# Patient Record
Sex: Female | Born: 1980 | Race: Black or African American | Hispanic: No | Marital: Single | Smoking: Never smoker
Health system: Southern US, Community
[De-identification: ages and names within clinical notes are randomized; demographics above are authoritative.]

## PROBLEM LIST (undated history)

## (undated) DIAGNOSIS — Z8709 Personal history of other diseases of the respiratory system: Secondary | ICD-10-CM

## (undated) DIAGNOSIS — Z8719 Personal history of other diseases of the digestive system: Secondary | ICD-10-CM

## (undated) DIAGNOSIS — Z8619 Personal history of other infectious and parasitic diseases: Secondary | ICD-10-CM

## (undated) DIAGNOSIS — E669 Obesity, unspecified: Secondary | ICD-10-CM

## (undated) DIAGNOSIS — Z87448 Personal history of other diseases of urinary system: Secondary | ICD-10-CM

## (undated) HISTORY — DX: Obesity, unspecified: E66.9

## (undated) HISTORY — DX: Personal history of other diseases of the respiratory system: Z87.09

## (undated) HISTORY — DX: Personal history of other diseases of urinary system: Z87.448

## (undated) HISTORY — DX: Personal history of other infectious and parasitic diseases: Z86.19

---

## 2001-05-14 HISTORY — PX: LAPAROSCOPIC GASTRIC BAND REMOVAL WITH LAPAROSCOPIC GASTRIC SLEEVE RESECTION: SHX6498

## 2004-07-28 ENCOUNTER — Other Ambulatory Visit: Admission: RE | Admit: 2004-07-28 | Discharge: 2004-07-28 | Payer: Self-pay | Admitting: Obstetrics and Gynecology

## 2004-12-13 ENCOUNTER — Ambulatory Visit (HOSPITAL_COMMUNITY): Admission: RE | Admit: 2004-12-13 | Discharge: 2004-12-13 | Payer: Self-pay | Admitting: Obstetrics and Gynecology

## 2004-12-14 ENCOUNTER — Ambulatory Visit: Payer: Self-pay | Admitting: Obstetrics & Gynecology

## 2004-12-21 ENCOUNTER — Ambulatory Visit (HOSPITAL_COMMUNITY): Admission: RE | Admit: 2004-12-21 | Discharge: 2004-12-21 | Payer: Self-pay | Admitting: Obstetrics and Gynecology

## 2005-02-22 ENCOUNTER — Inpatient Hospital Stay (HOSPITAL_COMMUNITY): Admission: AD | Admit: 2005-02-22 | Discharge: 2005-02-25 | Payer: Self-pay | Admitting: Obstetrics and Gynecology

## 2006-05-14 HISTORY — PX: HERNIA REPAIR: SHX51

## 2006-12-01 ENCOUNTER — Emergency Department: Payer: Self-pay | Admitting: Unknown Physician Specialty

## 2006-12-10 ENCOUNTER — Inpatient Hospital Stay: Payer: Self-pay | Admitting: Surgery

## 2008-07-17 ENCOUNTER — Emergency Department: Payer: Self-pay | Admitting: Emergency Medicine

## 2012-05-14 NOTE — L&D Delivery Note (Signed)
Delivery Note Pt progressed to complete and pushed well.  At 8:10 PM a viable female was delivered via Vaginal, Spontaneous Delivery (Presentation: Right Occiput Anterior).  APGAR: 9, 9; weight pending.   Placenta status: Intact, Spontaneous.  Cord:  with the following complications: None.  Anesthesia: Epidural  Episiotomy: None Lacerations: 1st degree Suture Repair: 3.0 vicryl rapide Est. Blood Loss (mL): 500  Mom to postpartum.  Baby to stay with mom.  Alailah Safley D 01/30/2013, 8:39 PM

## 2012-06-24 LAB — OB RESULTS CONSOLE RPR: RPR: NONREACTIVE

## 2012-06-24 LAB — OB RESULTS CONSOLE RUBELLA ANTIBODY, IGM: Rubella: IMMUNE

## 2012-06-24 LAB — OB RESULTS CONSOLE GC/CHLAMYDIA
Chlamydia: NEGATIVE
Gonorrhea: NEGATIVE

## 2012-06-24 LAB — OB RESULTS CONSOLE ABO/RH: RH Type: POSITIVE

## 2012-06-24 LAB — OB RESULTS CONSOLE HEPATITIS B SURFACE ANTIGEN: Hepatitis B Surface Ag: NEGATIVE

## 2012-06-24 LAB — OB RESULTS CONSOLE HIV ANTIBODY (ROUTINE TESTING): HIV: NONREACTIVE

## 2012-06-24 LAB — OB RESULTS CONSOLE ANTIBODY SCREEN: Antibody Screen: NEGATIVE

## 2012-10-16 ENCOUNTER — Observation Stay: Payer: Self-pay | Admitting: Obstetrics and Gynecology

## 2012-10-16 LAB — CBC WITH DIFFERENTIAL/PLATELET
Basophil #: 0.1 10*3/uL (ref 0.0–0.1)
Basophil %: 1 %
Eosinophil #: 0.3 10*3/uL (ref 0.0–0.7)
Eosinophil %: 2.2 %
HCT: 36.3 % (ref 35.0–47.0)
HGB: 12.3 g/dL (ref 12.0–16.0)
Lymphocyte #: 2.6 10*3/uL (ref 1.0–3.6)
Lymphocyte %: 21.2 %
MCH: 30.3 pg (ref 26.0–34.0)
MCHC: 33.9 g/dL (ref 32.0–36.0)
MCV: 89 fL (ref 80–100)
Monocyte #: 1 x10 3/mm — ABNORMAL HIGH (ref 0.2–0.9)
Monocyte %: 7.9 %
Neutrophil #: 8.2 10*3/uL — ABNORMAL HIGH (ref 1.4–6.5)
Neutrophil %: 67.7 %
Platelet: 224 10*3/uL (ref 150–440)
RBC: 4.06 10*6/uL (ref 3.80–5.20)
RDW: 12.7 % (ref 11.5–14.5)
WBC: 12.2 10*3/uL — ABNORMAL HIGH (ref 3.6–11.0)

## 2012-10-16 LAB — URINALYSIS, COMPLETE
Bilirubin,UR: NEGATIVE
Blood: NEGATIVE
Glucose,UR: NEGATIVE mg/dL (ref 0–75)
Ketone: NEGATIVE
Nitrite: NEGATIVE
Ph: 6 (ref 4.5–8.0)
Protein: NEGATIVE
RBC,UR: 5 /HPF (ref 0–5)
Specific Gravity: 1.018 (ref 1.003–1.030)
Squamous Epithelial: 2
WBC UR: 2 /HPF (ref 0–5)

## 2012-10-16 LAB — COMPREHENSIVE METABOLIC PANEL
Albumin: 2.9 g/dL — ABNORMAL LOW (ref 3.4–5.0)
Alkaline Phosphatase: 114 U/L (ref 50–136)
Anion Gap: 6 — ABNORMAL LOW (ref 7–16)
BUN: 6 mg/dL — ABNORMAL LOW (ref 7–18)
Bilirubin,Total: 0.3 mg/dL (ref 0.2–1.0)
Calcium, Total: 8.4 mg/dL — ABNORMAL LOW (ref 8.5–10.1)
Chloride: 107 mmol/L (ref 98–107)
Co2: 23 mmol/L (ref 21–32)
Creatinine: 0.53 mg/dL — ABNORMAL LOW (ref 0.60–1.30)
EGFR (African American): >60
EGFR (Non-African Amer.): >60
Glucose: 84 mg/dL (ref 65–99)
Osmolality: 269 (ref 275–301)
Potassium: 3.8 mmol/L (ref 3.5–5.1)
SGOT(AST): 21 U/L (ref 15–37)
SGPT (ALT): 26 U/L (ref 12–78)
Sodium: 136 mmol/L (ref 136–145)
Total Protein: 6.7 g/dL (ref 6.4–8.2)

## 2012-10-16 LAB — AMYLASE: Amylase: 61 U/L (ref 25–115)

## 2012-10-16 LAB — LIPASE, BLOOD: Lipase: 97 U/L (ref 73–393)

## 2012-11-25 ENCOUNTER — Emergency Department: Payer: Self-pay | Admitting: Emergency Medicine

## 2012-11-25 LAB — COMPREHENSIVE METABOLIC PANEL
Albumin: 3 g/dL — ABNORMAL LOW (ref 3.4–5.0)
Alkaline Phosphatase: 164 U/L — ABNORMAL HIGH (ref 50–136)
Anion Gap: 10 (ref 7–16)
BUN: 6 mg/dL — ABNORMAL LOW (ref 7–18)
Bilirubin,Total: 0.4 mg/dL (ref 0.2–1.0)
Calcium, Total: 8.8 mg/dL (ref 8.5–10.1)
Chloride: 106 mmol/L (ref 98–107)
Co2: 20 mmol/L — ABNORMAL LOW (ref 21–32)
Creatinine: 0.33 mg/dL — ABNORMAL LOW (ref 0.60–1.30)
EGFR (African American): >60
EGFR (Non-African Amer.): >60
Glucose: 73 mg/dL (ref 65–99)
Osmolality: 268 (ref 275–301)
Potassium: 4.4 mmol/L (ref 3.5–5.1)
SGOT(AST): 34 U/L (ref 15–37)
SGPT (ALT): 17 U/L (ref 12–78)
Sodium: 136 mmol/L (ref 136–145)
Total Protein: 7.1 g/dL (ref 6.4–8.2)

## 2012-11-25 LAB — CBC
HCT: 36.7 % (ref 35.0–47.0)
HGB: 12.4 g/dL (ref 12.0–16.0)
MCH: 29.9 pg (ref 26.0–34.0)
MCHC: 33.9 g/dL (ref 32.0–36.0)
MCV: 88 fL (ref 80–100)
Platelet: 207 10*3/uL (ref 150–440)
RBC: 4.16 10*6/uL (ref 3.80–5.20)
RDW: 12.6 % (ref 11.5–14.5)
WBC: 12.9 10*3/uL — ABNORMAL HIGH (ref 3.6–11.0)

## 2012-11-25 LAB — APTT: Activated PTT: 25.6 secs (ref 23.6–35.9)

## 2012-11-25 LAB — PROTIME-INR
INR: 1
Prothrombin Time: 13.7 secs (ref 11.5–14.7)

## 2013-01-02 LAB — OB RESULTS CONSOLE HIV ANTIBODY (ROUTINE TESTING): HIV: NONREACTIVE

## 2013-01-02 LAB — OB RESULTS CONSOLE RPR: RPR: NONREACTIVE

## 2013-01-02 LAB — OB RESULTS CONSOLE GBS: GBS: NEGATIVE

## 2013-01-26 ENCOUNTER — Telehealth (HOSPITAL_COMMUNITY): Payer: Self-pay | Admitting: *Deleted

## 2013-01-26 ENCOUNTER — Encounter (HOSPITAL_COMMUNITY): Payer: Self-pay | Admitting: *Deleted

## 2013-01-26 NOTE — Telephone Encounter (Signed)
Preadmission screen  

## 2013-01-30 ENCOUNTER — Encounter (HOSPITAL_COMMUNITY): Payer: Self-pay | Admitting: Anesthesiology

## 2013-01-30 ENCOUNTER — Inpatient Hospital Stay (HOSPITAL_COMMUNITY): Payer: BC Managed Care – PPO | Admitting: Anesthesiology

## 2013-01-30 ENCOUNTER — Inpatient Hospital Stay (HOSPITAL_COMMUNITY)
Admission: RE | Admit: 2013-01-30 | Discharge: 2013-02-01 | DRG: 373 | Disposition: A | Discharge status: Home / Self Care | Payer: BC Managed Care – PPO | Source: Ambulatory Visit | Attending: Obstetrics and Gynecology | Admitting: Obstetrics and Gynecology

## 2013-01-30 ENCOUNTER — Encounter (HOSPITAL_COMMUNITY): Payer: Self-pay

## 2013-01-30 VITALS — BP 146/83 | HR 84 | Temp 97.6°F | Resp 18 | Ht 64.0 in | Wt 308.0 lb

## 2013-01-30 DIAGNOSIS — O2650 Maternal hypotension syndrome, unspecified trimester: Secondary | ICD-10-CM | POA: Diagnosis not present

## 2013-01-30 DIAGNOSIS — Z3490 Encounter for supervision of normal pregnancy, unspecified, unspecified trimester: Secondary | ICD-10-CM

## 2013-01-30 HISTORY — DX: Personal history of other diseases of the digestive system: Z87.19

## 2013-01-30 LAB — CBC
HCT: 34.8 % — ABNORMAL LOW (ref 36.0–46.0)
Hemoglobin: 11.6 g/dL — ABNORMAL LOW (ref 12.0–15.0)
MCH: 28.1 pg (ref 26.0–34.0)
MCHC: 33.3 g/dL (ref 30.0–36.0)
MCV: 84.3 fL (ref 78.0–100.0)
Platelets: 231 10*3/uL (ref 150–400)
RBC: 4.13 MIL/uL (ref 3.87–5.11)
RDW: 12.9 % (ref 11.5–15.5)
WBC: 8.8 10*3/uL (ref 4.0–10.5)

## 2013-01-30 LAB — TYPE AND SCREEN
ABO/RH(D): O POS
Antibody Screen: NEGATIVE

## 2013-01-30 LAB — RPR: RPR Ser Ql: NONREACTIVE

## 2013-01-30 LAB — ABO/RH: ABO/RH(D): O POS

## 2013-01-30 MED ORDER — DIPHENHYDRAMINE HCL 25 MG PO CAPS: 25.0000 mg | ORAL_CAPSULE | Freq: Four times a day (QID) | ORAL | Status: DC | PRN

## 2013-01-30 MED ORDER — PHENYLEPHRINE 40 MCG/ML (10ML) SYRINGE FOR IV PUSH (FOR BLOOD PRESSURE SUPPORT)
80.0000 ug | PREFILLED_SYRINGE | INTRAVENOUS | Status: DC | PRN
  Filled 2013-01-30: qty 2

## 2013-01-30 MED ORDER — OXYTOCIN 40 UNITS IN LACTATED RINGERS INFUSION - SIMPLE MED
62.5000 mL/h | INTRAVENOUS | Status: DC
  Administered 2013-01-30: 62.5 mL/h via INTRAVENOUS

## 2013-01-30 MED ORDER — BUPIVACAINE HCL (PF) 0.25 % IJ SOLN
INTRAMUSCULAR | Status: DC | PRN
  Administered 2013-01-30 (×2): 5 mL

## 2013-01-30 MED ORDER — LANOLIN HYDROUS EX OINT: TOPICAL_OINTMENT | CUTANEOUS | Status: DC | PRN

## 2013-01-30 MED ORDER — FENTANYL 2.5 MCG/ML BUPIVACAINE 1/10 % EPIDURAL INFUSION (WH - ANES)
14.0000 mL/h | INTRAMUSCULAR | Status: DC | PRN
  Administered 2013-01-30 (×2): 14 mL/h via EPIDURAL
  Filled 2013-01-30 (×2): qty 125

## 2013-01-30 MED ORDER — PRENATAL MULTIVITAMIN CH
1.0000 | ORAL_TABLET | Freq: Every day | ORAL | Status: DC
  Administered 2013-01-31 – 2013-02-01 (×2): 1 via ORAL
  Filled 2013-01-30 (×2): qty 1

## 2013-01-30 MED ORDER — METHYLERGONOVINE MALEATE 0.2 MG/ML IJ SOLN: 0.2000 mg | INTRAMUSCULAR | Status: DC | PRN

## 2013-01-30 MED ORDER — OXYTOCIN BOLUS FROM INFUSION: 500.0000 mL | INTRAVENOUS | Status: DC

## 2013-01-30 MED ORDER — IBUPROFEN 600 MG PO TABS
600.0000 mg | ORAL_TABLET | Freq: Four times a day (QID) | ORAL | Status: DC | PRN
  Administered 2013-01-30: 600 mg via ORAL
  Filled 2013-01-30: qty 1

## 2013-01-30 MED ORDER — LIDOCAINE HCL (PF) 1 % IJ SOLN
INTRAMUSCULAR | Status: DC | PRN
  Administered 2013-01-30 (×4): 4 mL

## 2013-01-30 MED ORDER — OXYCODONE-ACETAMINOPHEN 5-325 MG PO TABS: 1.0000 | ORAL_TABLET | ORAL | Status: DC | PRN

## 2013-01-30 MED ORDER — LACTATED RINGERS IV SOLN
INTRAVENOUS | Status: DC
  Administered 2013-01-30 (×4): via INTRAVENOUS

## 2013-01-30 MED ORDER — ONDANSETRON HCL 4 MG/2ML IJ SOLN: 4.0000 mg | INTRAMUSCULAR | Status: DC | PRN

## 2013-01-30 MED ORDER — SENNOSIDES-DOCUSATE SODIUM 8.6-50 MG PO TABS
2.0000 | ORAL_TABLET | Freq: Every day | ORAL | Status: DC
  Administered 2013-01-31: 2 via ORAL

## 2013-01-30 MED ORDER — MAGNESIUM HYDROXIDE 400 MG/5ML PO SUSP: 30.0000 mL | ORAL | Status: DC | PRN

## 2013-01-30 MED ORDER — OXYCODONE-ACETAMINOPHEN 5-325 MG PO TABS
1.0000 | ORAL_TABLET | ORAL | Status: DC | PRN
Start: 2013-01-30 — End: 2013-01-30

## 2013-01-30 MED ORDER — ONDANSETRON HCL 4 MG/2ML IJ SOLN
4.0000 mg | Freq: Four times a day (QID) | INTRAMUSCULAR | Status: DC | PRN
  Administered 2013-01-30: 4 mg via INTRAVENOUS
  Filled 2013-01-30: qty 2

## 2013-01-30 MED ORDER — LIDOCAINE HCL (PF) 1 % IJ SOLN
30.0000 mL | INTRAMUSCULAR | Status: DC | PRN
  Filled 2013-01-30 (×2): qty 30

## 2013-01-30 MED ORDER — LACTATED RINGERS IV SOLN
500.0000 mL | Freq: Once | INTRAVENOUS | Status: AC
  Administered 2013-01-30: 500 mL via INTRAVENOUS

## 2013-01-30 MED ORDER — IBUPROFEN 600 MG PO TABS
600.0000 mg | ORAL_TABLET | Freq: Four times a day (QID) | ORAL | Status: DC
  Administered 2013-01-31 – 2013-02-01 (×6): 600 mg via ORAL
  Filled 2013-01-30 (×6): qty 1

## 2013-01-30 MED ORDER — CITRIC ACID-SODIUM CITRATE 334-500 MG/5ML PO SOLN: 30.0000 mL | ORAL | Status: DC | PRN

## 2013-01-30 MED ORDER — DIPHENHYDRAMINE HCL 50 MG/ML IJ SOLN: 12.5000 mg | INTRAMUSCULAR | Status: DC | PRN

## 2013-01-30 MED ORDER — DIBUCAINE 1 % RE OINT: 1.0000 "application " | TOPICAL_OINTMENT | RECTAL | Status: DC | PRN

## 2013-01-30 MED ORDER — WITCH HAZEL-GLYCERIN EX PADS: 1.0000 "application " | MEDICATED_PAD | CUTANEOUS | Status: DC | PRN

## 2013-01-30 MED ORDER — OXYTOCIN 40 UNITS IN LACTATED RINGERS INFUSION - SIMPLE MED
1.0000 m[IU]/min | INTRAVENOUS | Status: DC
  Administered 2013-01-30: 2 m[IU]/min via INTRAVENOUS
  Filled 2013-01-30: qty 1000

## 2013-01-30 MED ORDER — PHENYLEPHRINE 40 MCG/ML (10ML) SYRINGE FOR IV PUSH (FOR BLOOD PRESSURE SUPPORT)
80.0000 ug | PREFILLED_SYRINGE | INTRAVENOUS | Status: DC | PRN
  Filled 2013-01-30: qty 5
  Filled 2013-01-30: qty 2

## 2013-01-30 MED ORDER — TETANUS-DIPHTH-ACELL PERTUSSIS 5-2.5-18.5 LF-MCG/0.5 IM SUSP: 0.5000 mL | Freq: Once | INTRAMUSCULAR | Status: DC

## 2013-01-30 MED ORDER — LACTATED RINGERS IV SOLN
500.0000 mL | INTRAVENOUS | Status: DC | PRN
  Administered 2013-01-30: 500 mL via INTRAVENOUS

## 2013-01-30 MED ORDER — ONDANSETRON HCL 4 MG PO TABS: 4.0000 mg | ORAL_TABLET | ORAL | Status: DC | PRN

## 2013-01-30 MED ORDER — ZOLPIDEM TARTRATE 5 MG PO TABS: 5.0000 mg | ORAL_TABLET | Freq: Every evening | ORAL | Status: DC | PRN

## 2013-01-30 MED ORDER — EPHEDRINE 5 MG/ML INJ
10.0000 mg | INTRAVENOUS | Status: DC | PRN
  Administered 2013-01-30: 10 mg via INTRAVENOUS
  Filled 2013-01-30: qty 2

## 2013-01-30 MED ORDER — TERBUTALINE SULFATE 1 MG/ML IJ SOLN: 0.2500 mg | Freq: Once | INTRAMUSCULAR | Status: DC | PRN

## 2013-01-30 MED ORDER — BENZOCAINE-MENTHOL 20-0.5 % EX AERO: 1.0000 "application " | INHALATION_SPRAY | CUTANEOUS | Status: DC | PRN

## 2013-01-30 MED ORDER — SODIUM BICARBONATE 8.4 % IV SOLN
INTRAVENOUS | Status: DC | PRN
  Administered 2013-01-30: 5 mL via EPIDURAL

## 2013-01-30 MED ORDER — MEASLES, MUMPS & RUBELLA VAC ~~LOC~~ INJ: 0.5000 mL | INJECTION | Freq: Once | SUBCUTANEOUS | Status: DC

## 2013-01-30 MED ORDER — METHYLERGONOVINE MALEATE 0.2 MG PO TABS
0.2000 mg | ORAL_TABLET | ORAL | Status: DC | PRN
  Administered 2013-01-30 – 2013-01-31 (×4): 0.2 mg via ORAL
  Filled 2013-01-30 (×4): qty 1

## 2013-01-30 MED ORDER — EPHEDRINE 5 MG/ML INJ
10.0000 mg | INTRAVENOUS | Status: DC | PRN
  Filled 2013-01-30: qty 4
  Filled 2013-01-30: qty 2

## 2013-01-30 MED ORDER — ACETAMINOPHEN 325 MG PO TABS: 650.0000 mg | ORAL_TABLET | ORAL | Status: DC | PRN

## 2013-01-30 MED ORDER — SIMETHICONE 80 MG PO CHEW: 80.0000 mg | CHEWABLE_TABLET | ORAL | Status: DC | PRN

## 2013-01-30 NOTE — Anesthesia Preprocedure Evaluation (Signed)
Anesthesia Evaluation  Patient identified by MRN, date of birth, ID band Patient awake    Reviewed: Allergy & Precautions, H&P , NPO status , Patient's Chart, lab work & pertinent test results, reviewed documented beta blocker date and time   History of Anesthesia Complications Negative for: history of anesthetic complications  Airway Mallampati: I TM Distance: >3 FB Neck ROM: full    Dental  (+) Teeth Intact   Pulmonary neg pulmonary ROS,  breath sounds clear to auscultation        Cardiovascular negative cardio ROS  Rhythm:regular Rate:Normal     Neuro/Psych negative neurological ROS  negative psych ROS   GI/Hepatic Neg liver ROS, hiatal hernia (s/p repair), GERD- (with pregnancy, on tums and zantac)  ,  Endo/Other  Morbid obesity  Renal/GU negative Renal ROS     Musculoskeletal   Abdominal   Peds  Hematology negative hematology ROS (+)   Anesthesia Other Findings   Reproductive/Obstetrics (+) Pregnancy                           Anesthesia Physical Anesthesia Plan  ASA: III  Anesthesia Plan: Epidural   Post-op Pain Management:    Induction:   Airway Management Planned:   Additional Equipment:   Intra-op Plan:   Post-operative Plan:   Informed Consent: I have reviewed the patients History and Physical, chart, labs and discussed the procedure including the risks, benefits and alternatives for the proposed anesthesia with the patient or authorized representative who has indicated his/her understanding and acceptance.     Plan Discussed with:   Anesthesia Plan Comments:         Anesthesia Quick Evaluation

## 2013-01-30 NOTE — Progress Notes (Signed)
Comfortable with epidural Afeb, VSS FHT- Cat I, ctx q 2-3 min VE-2/70/-1, vtx, AROM clear Continue pitocin, monitor progress, anticipate SVD

## 2013-01-30 NOTE — H&P (Signed)
Katera Rybka is a 31 y.o. female, G3 P2002, EGA 40+ weeks with EDC 9-16 presenting for elective induction.  Prenatal care complicated by h/o HTN, recent slightly elevated BP with normal PIH labs, normal f/u BP.  See prenatal records for complete history.  Maternal Medical History:  Fetal activity: Perceived fetal activity is normal.      OB History   Grav Para Term Preterm Abortions TAB SAB Ect Mult Living   3 2 2  0 0 0 0 0 0 2    SVD at term x 2  Past Medical History  Diagnosis Date  . GERD (gastroesophageal reflux disease)   . Hx of pyelonephritis   . Obesity   . H/O hiatal hernia    Past Surgical History  Procedure Laterality Date  . Hernia repair      umbilical repaired with mesh   Family History: family history includes COPD in her mother; Cancer in her maternal grandmother and mother. Social History:  reports that she has never smoked. She does not have any smokeless tobacco history on file. She reports that she does not drink alcohol or use illicit drugs.   Prenatal Transfer Tool  Maternal Diabetes: No Genetic Screening: Normal Maternal Ultrasounds/Referrals: Normal Fetal Ultrasounds or other Referrals:  None Maternal Substance Abuse:  No Significant Maternal Medications:  None Significant Maternal Lab Results:  Lab values include: Group B Strep negative Other Comments:  None  Review of Systems  Respiratory: Negative.   Cardiovascular: Negative.    VE-1-2/70/-2, vtx   Blood pressure 115/53, pulse 112, height 5\' 4"  (1.626 m), weight 139.708 kg (308 lb), last menstrual period 04/17/2012. Maternal Exam:  Abdomen: Patient reports no abdominal tenderness. Estimated fetal weight is 8 lbs.   Fetal presentation: vertex  Introitus: Normal vulva. Normal vagina.  Amniotic fluid character: not assessed.  Pelvis: adequate for delivery.   Cervix: Cervix evaluated by digital exam.     Fetal Exam Fetal Monitor Review: Mode: ultrasound.   Baseline rate: 140.   Variability: moderate (6-25 bpm).   Pattern: accelerations present and no decelerations.    Fetal State Assessment: Category I - tracings are normal.     Physical Exam  Constitutional: She appears well-developed and well-nourished.  Cardiovascular: Normal rate, regular rhythm and normal heart sounds.   No murmur heard. Respiratory: Effort normal and breath sounds normal. No respiratory distress. She has no wheezes.  GI: Soft.  Gravid     Prenatal labs: ABO, Rh:  O pos Antibody:  neg Rubella:  NR RPR: Nonreactive (08/22 0000)  HBsAg:   Neg HIV: Non-reactive (08/22 0000)  GBS: Negative (08/22 0000) GCT:  93   Assessment/Plan: IUP at 40+ weeks for elective induction.  Will start pitocin, monitor progress, AROM when possible.   Milania Haubner D 01/30/2013, 8:41 AM

## 2013-01-30 NOTE — Anesthesia Procedure Notes (Addendum)
Epidural Patient location during procedure: OB Start time: 01/30/2013 12:28 PM  Staffing Performed by: anesthesiologist   Preanesthetic Checklist Completed: patient identified, site marked, surgical consent, pre-op evaluation, timeout performed, IV checked, risks and benefits discussed and monitors and equipment checked  Epidural Patient position: sitting Prep: site prepped and draped and DuraPrep Patient monitoring: continuous pulse ox and blood pressure Approach: midline Injection technique: LOR air  Needle:  Needle type: Tuohy  Needle gauge: 17 G Needle length: 9 cm and 9 Needle insertion depth: 5 cm and 7.5 cm Catheter type: closed end flexible Catheter size: 19 Gauge Catheter at skin depth: 10 and 12.5 cm Test dose: negative  Assessment Events: blood not aspirated, injection not painful, no injection resistance, negative IV test and no paresthesia  Additional Notes Discussed risk of headache, infection, bleeding, nerve injury and failed or incomplete block.  Patient voices understanding and wishes to proceed.  Epidural placed easily on first attempt.  No paresthesia.  Patient tolerated procedure well with no apparent complications.  Jasmine December, MDReason for block:procedure for pain  Epidural Patient location during procedure: OB  Staffing Anesthesiologist: Jiles Garter  Preanesthetic Checklist Completed: patient identified, site marked, surgical consent, pre-op evaluation, timeout performed, IV checked, risks and benefits discussed and monitors and equipment checked  Epidural Patient position: sitting Prep: site prepped and draped and DuraPrep Patient monitoring: continuous pulse ox and blood pressure Approach: midline Injection technique: LOR air  Needle:  Needle type: Tuohy  Needle gauge: 17 G Needle length: 9 cm and 9 Needle insertion depth: 8 cm Catheter type: closed end flexible Catheter size: 19 Gauge Catheter at skin depth: 16 cm Test  dose: negative  Assessment Events: blood not aspirated, injection not painful, no injection resistance, negative IV test and no paresthesia  Additional Notes Dosing of Epidural:  1st dose, through catheter ............................................Marland Kitchen epi 1:200K + Xylocaine 40 mg  2nd dose, through catheter, after waiting 3 minutes...Marland KitchenMarland Kitchenepi 1:200K + Xylocaine 60 mg    ( 2% Xylo charted as a single dose in Epic Meds for ease of charting; actual dosing was fractionated as above, for saftey's sake)  As each dose occurred, patient was free of IV sx; and patient exhibited no evidence of SA injection.  Patient is more comfortable after epidural dosed. Please see RN's note for documentation of vital signs,and FHR which are stable.  Patient reminded not to try to ambulate with numb legs, and that an RN must be present when she attempts to get up.

## 2013-01-31 LAB — CBC
HCT: 28.8 % — ABNORMAL LOW (ref 36.0–46.0)
Hemoglobin: 9.8 g/dL — ABNORMAL LOW (ref 12.0–15.0)
MCH: 28.8 pg (ref 26.0–34.0)
MCHC: 34 g/dL (ref 30.0–36.0)
MCV: 84.7 fL (ref 78.0–100.0)
Platelets: 188 10*3/uL (ref 150–400)
RBC: 3.4 MIL/uL — ABNORMAL LOW (ref 3.87–5.11)
RDW: 13 % (ref 11.5–15.5)
WBC: 11.5 10*3/uL — ABNORMAL HIGH (ref 4.0–10.5)

## 2013-01-31 MED ORDER — METHYLERGONOVINE MALEATE 0.2 MG PO TABS: 0.2000 mg | ORAL_TABLET | ORAL | Status: DC | PRN

## 2013-01-31 NOTE — Lactation Note (Signed)
This note was copied from the chart of [name]. Lactation Consultation Note  Patient Name: [name] Date: 01/31/2013 Reason for consult: Initial assessment Mom had baby latched in football hold on right breast when I arrived. Latch appeared to be good, adjusted bottom lip. Baby demonstrated a good rhythmic suck, some stimulation needed to keep active at the breast. Mom reports she has nipple soreness and had some bleeding with the last BF. Slight compression noted when baby came off the breast at this visit. Bruising and positional stripes bilateral. Baby asleep and would not re-latch. Demonstrated to Mom how to use breast compression "tea cup hold" to sandwich nipple to obtain more depth with the latch.  Reviewed how to support breast during BF. Mom reported less discomfort with this breastfeeding session. Care for sore nipples reviewed. Comfort gels given with instructions. Encouraged to que base BF, at least every 3 hours. Cluster feeding discussed. Lactation brochure left for review. Advised of OP services and support group. Advised Mom to call for assist with latching baby if she continues to experience discomfort.   Maternal Data Formula Feeding for Exclusion: No Infant to breast within first hour of birth: Yes Has patient been taught Hand Expression?: Yes Does the patient have breastfeeding experience prior to this delivery?: No  Feeding Feeding Type: Breast Milk Length of feed: 20 min  LATCH Score/Interventions Latch: Repeated attempts needed to sustain latch, nipple held in mouth throughout feeding, stimulation needed to elicit sucking reflex. Intervention(s): Breast massage;Breast compression  Audible Swallowing: A few with stimulation  Type of Nipple: Flat  Comfort (Breast/Nipple): Filling, red/small blisters or bruises, mild/mod discomfort (positional stripes bilateral)  Problem noted: Cracked, bleeding, blisters, bruises;Mild/Moderate  discomfort Interventions  (Cracked/bleeding/bruising/blister): Expressed breast milk to nipple Interventions (Mild/moderate discomfort): Comfort gels  Hold (Positioning): No assistance needed to correctly position infant at breast.  LATCH Score: 6  Lactation Tools Discussed/Used Tools: Comfort gels WIC Program: Yes   Consult Status Consult Status: Follow-up Date: 02/01/13 Follow-up type: In-patient    Janet Gonzales 01/31/2013, 8:42 PM

## 2013-01-31 NOTE — Progress Notes (Signed)
PPD #1 No problems, fell last night-better now Afeb, VSS Fundus firm, NT at U-1 Hgb 11.6 to 9.8 Continue routine postpartum care

## 2013-01-31 NOTE — Significant Event (Signed)
Rapid Response Event Note - called for pt. That had increased vaginal bleeding and "passed out" while OOB in BR  Overview: Time Called: 2305 Arrival Time: 2345 Event Type: Hypotension;Other (Comment)  Initial Focused Assessment: When arrived pt. In bed, bedside RN, [facility] and CRNA present, fundus firm, minimal bleeding noted at this time, pt. A&O and denying any further dizziness.  Interventions: fundus had already been massaged, LR fluid bolus started, orders received from Dr. Jackelyn Knife for po methergine, frequent VS and bleeding assessments continue   Event Summary: Pt. Stabilized and remained in her current room, pt. Starting to initiate breast feeding baby when i was leaving room. No c/o Name of Physician Notified: Dr. Jackelyn Knife at 2355    at    Outcome: Stayed in room and stabalized  Event End Time: 0015  Mallie Darting

## 2013-01-31 NOTE — Progress Notes (Signed)
2345 Pt having intermittent moderate trickling , fundus firm deviated to the left.  Pt sat on side of bed and wanted to try going to the bathroom.  No dizziness.  Pt stood at side of bed and claims she felt fine.  Proceeded to take patient to bathroom.  On the way to the bathroom Patient got dizzy.  Pt sat on bathroom commode and passed a few small clots.  Pt fainted on bathroom commode held up by nurses.  Nurses got patient back to bed with stedy and rechecked fundus and bleeding. Rapid response called.  Newborn sent to nursery. Pt firm, one above and some additional trickling with massage.  Rapid response spoke with Meisinger.  Order given to start PO methergine series and give patient LR bolus.  Medications given and IV fluids continued.  Pt rechecked and bleeding small, fundus firm with no additional trickling.  Pt resting.  Will continue to monitor. Dahlia Byes Boschen

## 2013-01-31 NOTE — Anesthesia Postprocedure Evaluation (Signed)
Anesthesia Post Note  Patient: Janet Gonzales  Procedure(s) Performed: * No procedures listed *  Anesthesia type: Epidural  Patient location: Mother/Baby  Post pain: Pain level controlled  Post assessment: Post-op Vital signs reviewed  Last Vitals:  Filed Vitals:   01/31/13 0400  BP: 109/78  Pulse: 87  Temp: 36.8 C  Resp: 18    Post vital signs: Reviewed  Level of consciousness:alert  Complications: No apparent anesthesia complications

## 2013-02-01 NOTE — Discharge Summary (Signed)
Obstetric Discharge Summary Reason for Admission: induction of labor Prenatal Procedures: none Intrapartum Procedures: spontaneous vaginal delivery Postpartum Procedures: none Complications-Operative and Postpartum: 1st degree perineal laceration Hemoglobin  Date Value Range Status  01/31/2013 9.8* 12.0 - 15.0 g/dL Final     HCT  Date Value Range Status  01/31/2013 28.8* 36.0 - 46.0 % Final    Physical Exam:  General: alert Lochia: appropriate Uterine Fundus: firm  Discharge Diagnoses: Term Pregnancy-delivered  Discharge Information: Date: 02/01/2013 Activity: pelvic rest Diet: routine Medications: Ibuprofen Condition: stable Instructions: refer to practice specific booklet Discharge to: home Follow-up Information   Follow up with Hoke Baer D, MD. Schedule an appointment as soon as possible for a visit in 6 weeks.   Specialty:  Obstetrics and Gynecology   Contact information:   [address], [address] [place] [address] [phone]       Newborn Data: Live born female  Birth Weight: 7 lb 12.7 oz (3535 g) APGAR: 9, 9  Baby to stay on bili lights  Meridith Romick D 02/01/2013, 9:37 AM

## 2013-02-01 NOTE — Progress Notes (Signed)
PPD #2 No problems, no significant bleeding, baby will be staying on bili lights Afeb, VSS Fundus firm D/c home

## 2013-02-01 NOTE — Discharge Instructions (Signed)
As per discharge pamphlet °

## 2013-02-02 ENCOUNTER — Ambulatory Visit: Payer: Self-pay

## 2013-02-02 NOTE — Lactation Note (Signed)
This note was copied from the chart of Janet Abryana Lykens. Lactation Consultation Note Mom states she has been giving formula, but does want to provide breast milk for baby. Mom states she pumped last night but has not pumped today yet.  Reviewed the importance of pumping, mom understands, and states she does plan to pump. Mom unable to give a reason for not pumping. Mom has a friend at the bedside who is also encouraging mom to use the pump.  Enc mom to call if she has any concerns.  Patient Name: Janet Gonzales [id number] Date: 02/02/2013     Maternal Data    Feeding Feeding Type: Formula  LATCH Score/Interventions                      Lactation Tools Discussed/Used     Consult Status      Lenard Forth 02/02/2013, 11:41 AM

## 2013-02-14 NOTE — Addendum Note (Signed)
Addendum created 02/14/13 1244 by Jiles Garter, MD   Modules edited: Anesthesia Responsible Staff

## 2013-03-14 LAB — HM PAP SMEAR: HM Pap smear: NORMAL

## 2013-11-19 IMAGING — US US EXTREM LOW VENOUS*R*
1 series · 14 of 24 positions shown · non-contrast
Comparison: none

REASON FOR EXAM: fall 10 days ago, cont pain, increased swelling today,
pregnant
COMMENTS:

PROCEDURE:     US  - US DOPPLER LOW EXTR RIGHT  - November 25, 2012  [DATE]
RESULT:     Comparison: None

[Series 1: us extrem low venous*right* · 0.13mm/px · 14 of 34 slices shown]
[im 1/34]
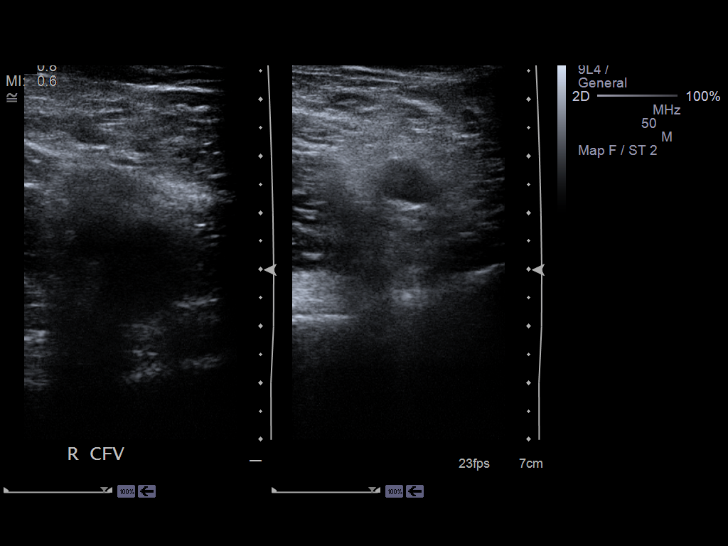
[im 3/34]
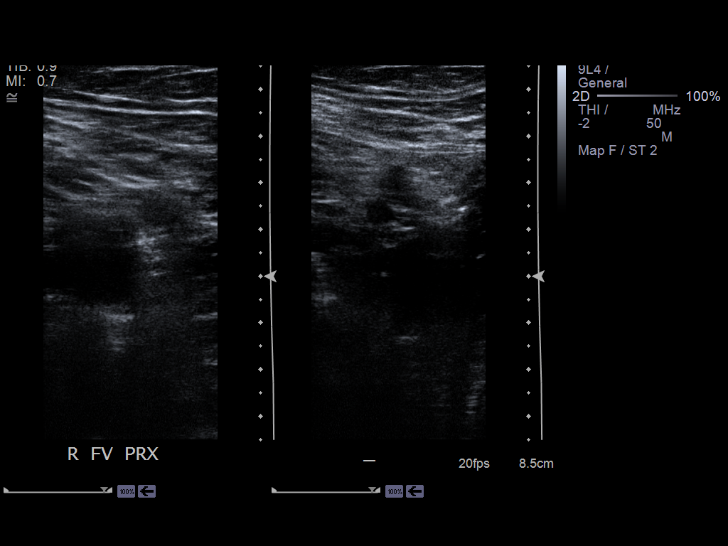
[im 6/34]
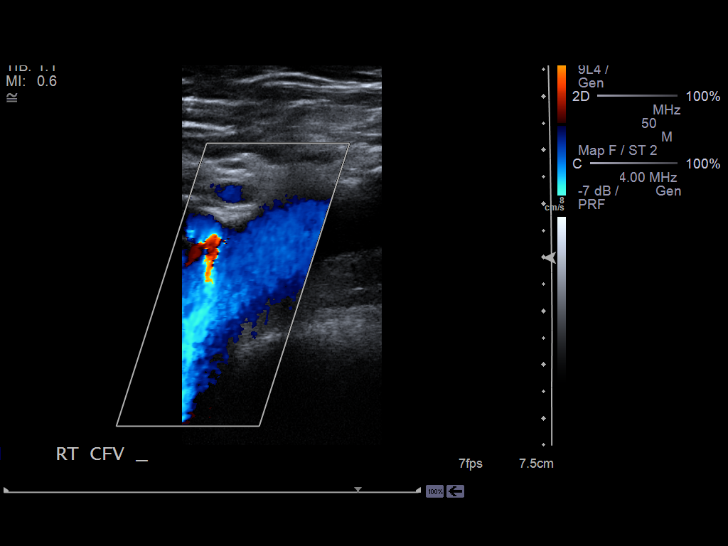
[im 9/34]
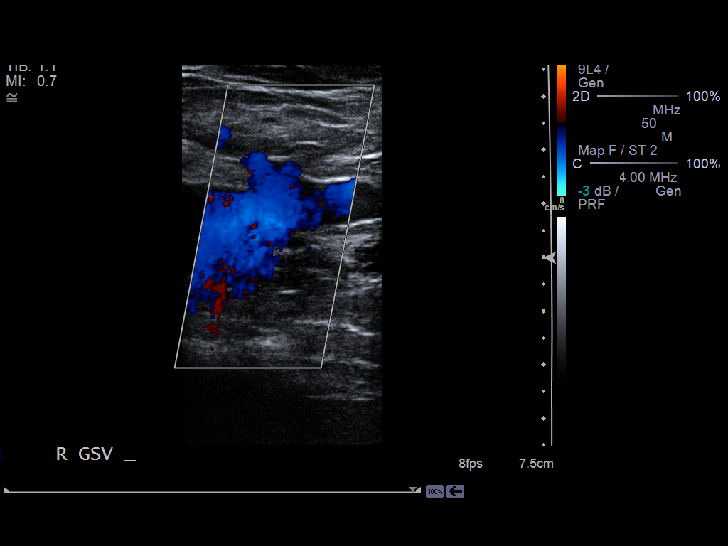
[im 11/34]
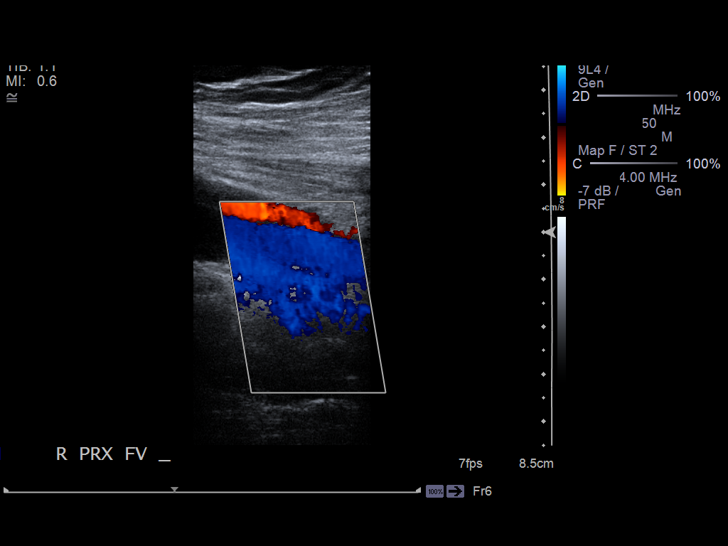
[im 13/34]
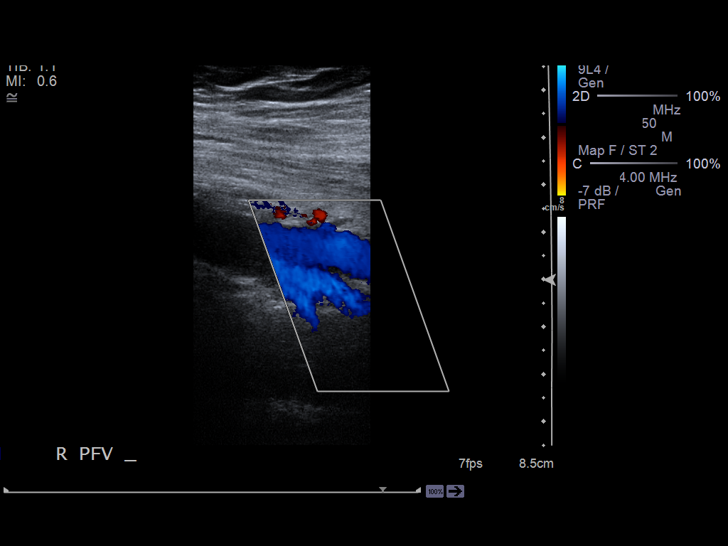
[im 16/34]
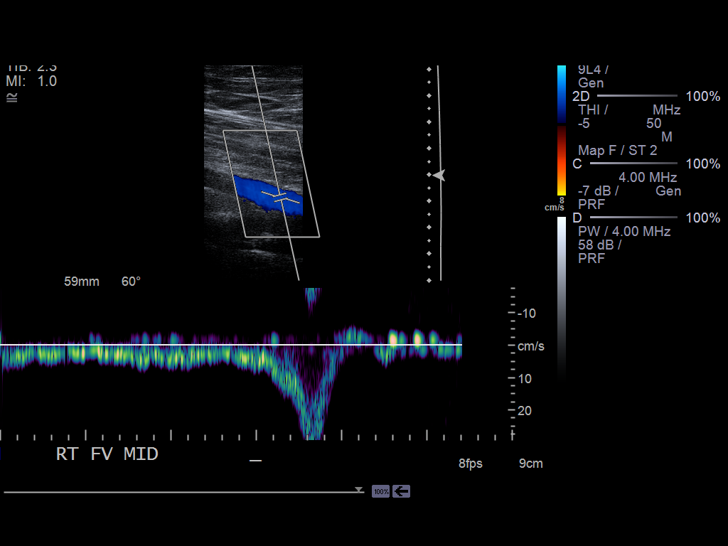
[im 18/34]
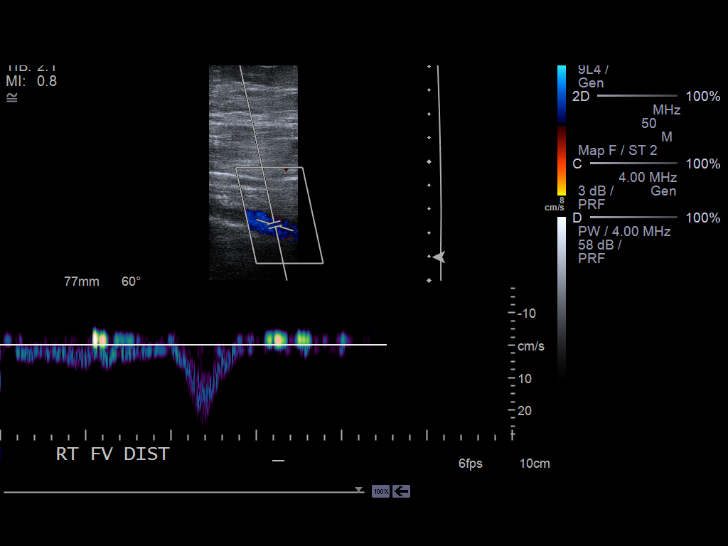
[im 21/34]
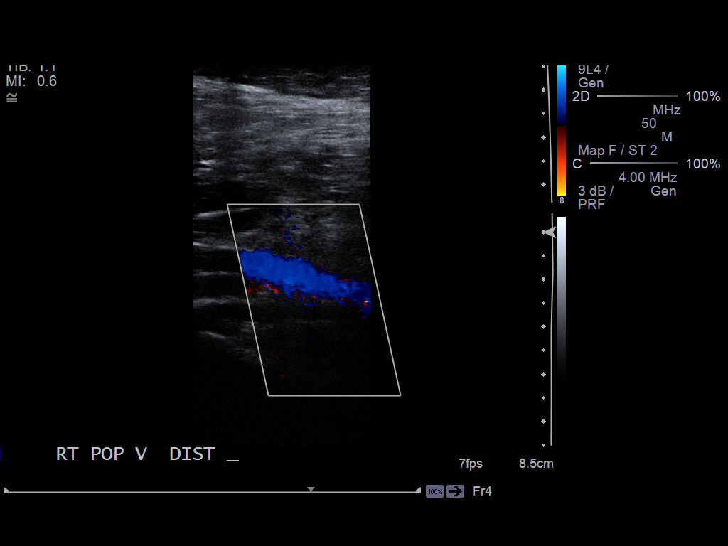
[im 23/34]
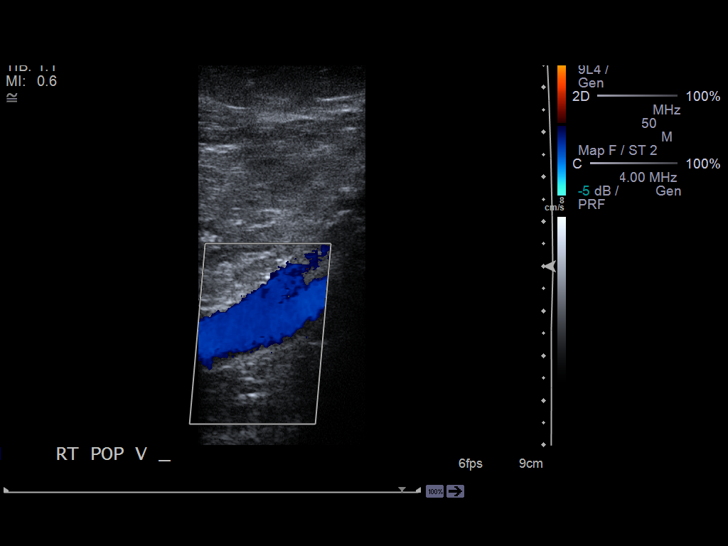
[im 26/34]
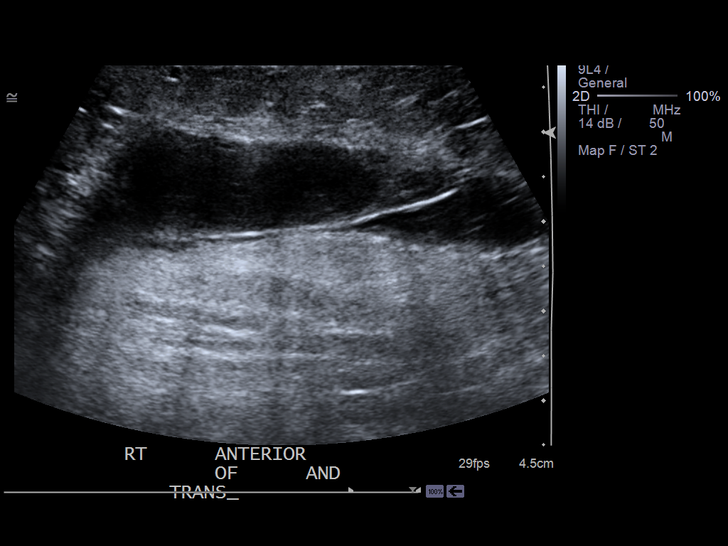
[im 28/34]
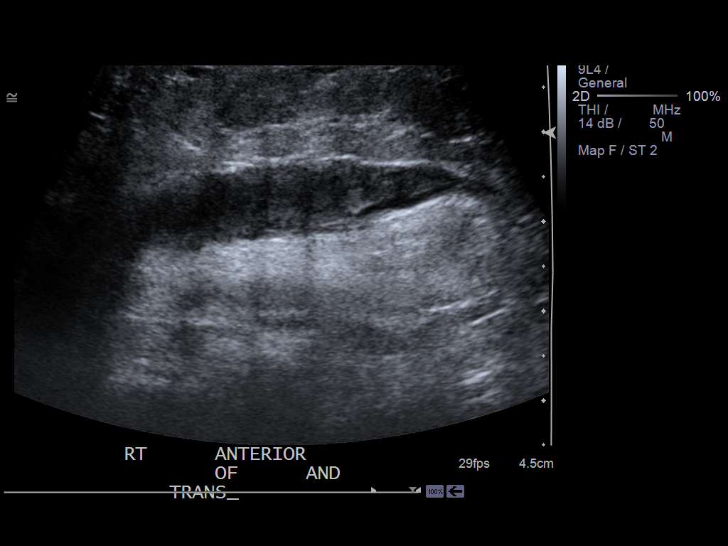
[im 31/34]
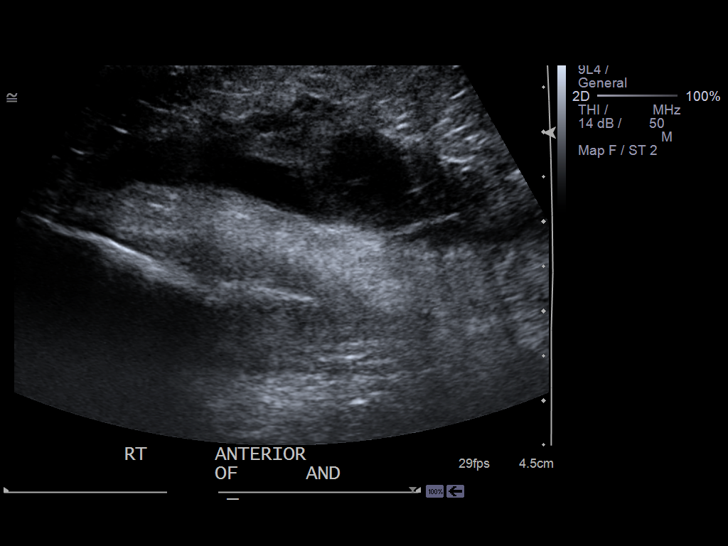
[im 34/34]
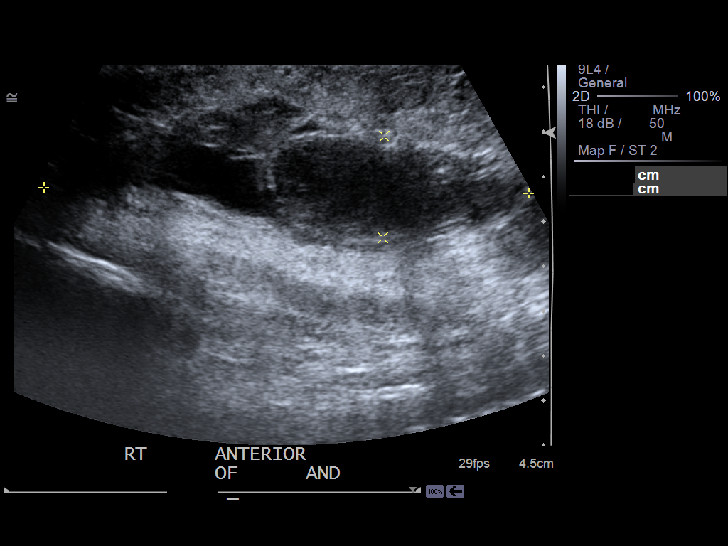

[14 of 24 positions shown; findings below may reference images not displayed]

FINDINGS: Multiple longitudinal and transverse gray-scale as well as color
and spectral Doppler images of the right lower extremity veins were obtained
from the common femoral veins through the popliteal veins.

The right common femoral, greater saphenous, femoral, popliteal veins, and
venous trifurcation are patent, demonstrating normal color-flow and
compressibility. No intraluminal thrombus is identified.There is normal
respiratory variation and augmentation demonstrated at all vein levels.

There is a 5.4 x 1.2 x 5.3 cm hypoechoic fluid collection along the anterior
aspect of the knee likely representing a hematoma. There is no internal
Doppler flow.
IMPRESSION: No evidence of DVT in the right lower extremity.

There is a 5.4 x 1.2 x 5.3 cm hypoechoic fluid collection along the anterior
aspect of the knee likely representing a hematoma.

[REDACTED]

## 2014-03-15 ENCOUNTER — Encounter (HOSPITAL_COMMUNITY): Payer: Self-pay

## 2014-05-21 ENCOUNTER — Encounter (INDEPENDENT_AMBULATORY_CARE_PROVIDER_SITE_OTHER): Payer: Self-pay

## 2014-05-21 ENCOUNTER — Encounter: Payer: Self-pay | Admitting: Nurse Practitioner

## 2014-05-21 ENCOUNTER — Ambulatory Visit (INDEPENDENT_AMBULATORY_CARE_PROVIDER_SITE_OTHER): Payer: BLUE CROSS/BLUE SHIELD | Admitting: Nurse Practitioner

## 2014-05-21 DIAGNOSIS — Z23 Encounter for immunization: Secondary | ICD-10-CM

## 2014-05-21 NOTE — Progress Notes (Signed)
Subjective:    Patient ID: Janet Gonzales, female    DOB: [birth year], 34 y.o.   MRN: [id number]  HPI  Ms. Janet Gonzales is a 34 yo female with a CC of severe obesity.   1) Began mid December with steps to take for bariatric surgery. She is required to visit a PCP 7 times in 6 months. The Dr. Is Delrae Alfred[facility]olfo Fernandez at [facility].   Barium swallow this morning- negative for significant finding patient reports.   Tried adipex without long-term results from a weight loss clinic in [place], [place]. Nutritional counseling in past and for bariatric surgery.   Diet- No sodas, journaling food every day through MyFitness Pal. 1200-1400 calories a day is goal.   Exercise- Plans to sign up at a Gym. 2-3 x a week goal   Goals- track what she eats, eats enough of proper foods, being more active with the kids.   LMP- IUD in 03/2013   Body mass index is 46.67 kg/(m^2).   Review of Systems  Constitutional: Positive for activity change and appetite change. Negative for fever, chills, diaphoresis, fatigue and unexpected weight change.       Increasing activity, less hungry at night time due to increase of protein during day.   HENT: Negative for congestion, sinus pressure and tinnitus.   Eyes: Negative for visual disturbance.  Respiratory: Negative for cough, shortness of breath and wheezing.   Gastrointestinal: Negative for nausea, vomiting, abdominal pain and diarrhea.  Endocrine: Negative for cold intolerance, heat intolerance, polydipsia, polyphagia and polyuria.  Genitourinary: Positive for frequency. Negative for dysuria, vaginal discharge, difficulty urinating and vaginal pain.       Drinking more water  Musculoskeletal: Negative for back pain and gait problem.  Skin: Negative for rash and wound.  Allergic/Immunologic: Positive for environmental allergies. Negative for food allergies.       Pollen  Neurological: Negative for dizziness, weakness and headaches.  Hematological: Does  not bruise/bleed easily.  Psychiatric/Behavioral: Positive for sleep disturbance. Negative for suicidal ideas, confusion and decreased concentration.       34 year old   Past Medical History  Diagnosis Date  . GERD (gastroesophageal reflux disease)   . Hx of pyelonephritis   . Obesity   . H/O hiatal hernia   . History of chicken pox   . History of hay fever   . Hypertension   . HIV infection 2014    Negative    History   Social History  . Marital Status: Single    Spouse Name: N/A    Number of Children: N/A  . Years of Education: N/A   Occupational History  . Not on file.   Social History Main Topics  . Smoking status: Never Smoker   . Smokeless tobacco: Not on file  . Alcohol Use: No  . Drug Use: No  . Sexual Activity:    Partners: Male     Comment: 1 partner   Other Topics Concern  . 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 Hernia repair  2008    umbilical repaired with mesh    Family History  Problem Relation Age of  Onset  . COPD Mother   . Cancer Mother   . Cancer Maternal Grandmother     No Known Allergies  No current outpatient prescriptions on file prior to visit.   No current facility-administered medications on file prior to visit.       Objective:   Physical Exam  Constitutional: She is oriented to person, place, and time. She appears well-developed and well-nourished. No distress.  HENT:  Head: Normocephalic and atraumatic.  Mouth/Throat: No oropharyngeal exudate.  TM's clear bilaterally  Eyes: Conjunctivae and EOM are normal. Pupils are equal, round, and reactive to light.  Neck: Normal range of motion. Neck supple. No thyromegaly present.  Cardiovascular: Normal rate and regular  rhythm.   Pulmonary/Chest: Effort normal and breath sounds normal.  Abdominal: Soft. Bowel sounds are normal. She exhibits no distension and no mass. There is no tenderness. There is no rebound and no guarding.  Musculoskeletal: Normal range of motion. She exhibits no edema or tenderness.  Lymphadenopathy:    She has no cervical adenopathy.  Neurological: She is alert and oriented to person, place, and time. She displays normal reflexes. No cranial nerve deficit. She exhibits normal muscle tone. Coordination normal.  Skin: Skin is warm and dry. No rash noted. She is not diaphoretic.  Psychiatric: She has a normal mood and affect. Her behavior is normal. Judgment and thought content normal.   BP 146/91 mmHg  Pulse 92  Temp(Src) 98.1 F (36.7 C) (Oral)  Resp 12  Ht  (1.676 m)  Wt 289 lb (131.09 kg)  BMI 46.67 kg/m2  SpO2 98% Repeat BP was 138/86 Left arm sitting     Assessment & Plan:

## 2014-05-21 NOTE — Progress Notes (Signed)
Pre visit review using our clinic review tool, if applicable. No additional management support is needed unless otherwise documented below in the visit note. 

## 2014-05-21 NOTE — Patient Instructions (Signed)
Goal BP is below 140/90.   I will see you next month! Keep up the great work!

## 2014-05-24 NOTE — Assessment & Plan Note (Signed)
Stable. Pt is going through steps to get bariatric surgery through [facility]. She is here for visit 1 of 7 and they must be completed within 6 months. She is a pleasant person and I think she will be successful with this journey. She has goals and is completing all that is asked from her. Visit 2 in Feb.

## 2014-06-18 ENCOUNTER — Encounter: Payer: Self-pay | Admitting: Nurse Practitioner

## 2014-06-18 ENCOUNTER — Ambulatory Visit (INDEPENDENT_AMBULATORY_CARE_PROVIDER_SITE_OTHER): Payer: BLUE CROSS/BLUE SHIELD | Admitting: Nurse Practitioner

## 2014-06-18 VITALS — BP 144/82 | HR 97 | Temp 97.8°F | Resp 12 | Ht 67.0 in | Wt 289.0 lb

## 2014-06-18 DIAGNOSIS — I10 Essential (primary) hypertension: Secondary | ICD-10-CM

## 2014-06-18 MED ORDER — HYDROCHLOROTHIAZIDE 12.5 MG PO TABS: 12.5000 mg | ORAL_TABLET | Freq: Every day | ORAL | Status: DC

## 2014-06-18 NOTE — Progress Notes (Signed)
Subjective:    Patient ID: Chilton Si, female    DOB: [birth year], 34 y.o.   MRN: [id number]  HPI  Ms. Sprowl is a 34 yo female with a CC of her 2nd meeting for weight loss visit.   1) More appointments for weight loss surgery Eating protein bars, shakes, and sticking to what her nutritionist outlined.   Walking 15-20 min a day 4-5 days a week   Has not signed up for a gym membership yet  Tracking what she is eating, my fitness pal   Very frustrated that she is not losing any weight, but pleased she has not gained.  Nutritionist visit 1:1 Exercise visit also  2) Goals- not to gain weight, goal at the end to lose weight   4.4 potasium in Dec. At [facility]    Review of Systems  Constitutional: Negative for fever, chills, diaphoresis and fatigue.  Eyes: Negative for visual disturbance.  Respiratory: Negative for chest tightness, shortness of breath and wheezing.   Cardiovascular: Negative for chest pain, palpitations and leg swelling.  Gastrointestinal: Negative for nausea, vomiting, diarrhea and rectal pain.  Endocrine: Negative for cold intolerance, heat intolerance, polydipsia, polyphagia and polyuria.  Skin: Negative for rash.  Neurological: Negative for dizziness, weakness, numbness and headaches.  Psychiatric/Behavioral: The patient is not nervous/anxious.    Past Medical History  Diagnosis Date  . GERD (gastroesophageal reflux disease)   . Hx of pyelonephritis   . Obesity   . H/O hiatal hernia   . History of chicken pox   . History of hay fever   . Hypertension   . HIV infection 2014    Negative    History   Social History  . Marital Status: Single    Spouse Name: N/A    Number of Children: N/A  . Years of Education: N/A   Occupational History  . Not on file.   Social History Main Topics  . Smoking status: Never Smoker   . Smokeless tobacco: Not on file  . Alcohol Use: No  . Drug Use: No  . Sexual Activity:    Partners: Male   Comment: 1 partner   Other Topics Concern  . 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 Hernia repair  2008    umbilical repaired with mesh    Family History  Problem Relation Age of Onset  . COPD Mother   . Cancer Mother   . Cancer Maternal Grandmother     No Known Allergies  No current outpatient prescriptions on file prior to visit.   No current facility-administered medications on file prior to visit.      Objective:   Physical Exam  Constitutional: She is oriented to person, place, and time. She appears well-developed and well-nourished. No distress.  BP 144/82 mmHg  Pulse 97  Temp(Src) 97.8 F (36.6 C) (Oral)  Resp 12  Ht  (1.702 m)  Wt 289 lb (131.09 kg)  BMI 45.25 kg/m2  SpO2 98%   HENT:  Head: Normocephalic and atraumatic.  Right Ear: External ear normal.  Left Ear: External ear normal.  Neck: Normal range of motion. Neck supple.  Cardiovascular: Normal rate, regular rhythm, normal heart sounds and intact distal pulses.  Exam reveals no gallop and no friction rub.   No murmur heard. Pulmonary/Chest: Effort normal and breath sounds normal. No respiratory distress. She has no wheezes. She has no rales. She exhibits no tenderness.  Abdominal: Soft. She exhibits no distension and no mass. There is no tenderness. There is no rebound and no guarding.  Obese  Musculoskeletal: Normal range of motion. She exhibits no edema or tenderness.  Neurological: She is alert and oriented to person, place, and time. No cranial nerve deficit. She exhibits normal muscle tone. Coordination normal.  Skin: Skin is warm and dry. No rash noted. She is not diaphoretic.  Psychiatric: She has a normal mood and affect. Her  behavior is normal. Judgment and thought content normal.      Assessment & Plan:

## 2014-06-18 NOTE — Progress Notes (Signed)
Pre visit review using our clinic review tool, if applicable. No additional management support is needed unless otherwise documented below in the visit note. 

## 2014-06-18 NOTE — Patient Instructions (Signed)
We will see you next month!   Keep up the good work!

## 2014-06-21 DIAGNOSIS — I10 Essential (primary) hypertension: Secondary | ICD-10-CM | POA: Insufficient documentation

## 2014-06-21 NOTE — Assessment & Plan Note (Addendum)
Worsening. Three reading of BP elevation. Script for HCTZ 12.5 mg daily. Record BP and bring to next month follow up. [facility] and was [facility].   BP Readings from Last 3 Encounters:  06/18/14 144/82  05/21/14 146/91  02/01/13 146/83

## 2014-07-23 ENCOUNTER — Ambulatory Visit (INDEPENDENT_AMBULATORY_CARE_PROVIDER_SITE_OTHER): Payer: BLUE CROSS/BLUE SHIELD | Admitting: Nurse Practitioner

## 2014-07-23 ENCOUNTER — Encounter: Payer: Self-pay | Admitting: Nurse Practitioner

## 2014-07-23 DIAGNOSIS — I10 Essential (primary) hypertension: Secondary | ICD-10-CM

## 2014-07-23 NOTE — Assessment & Plan Note (Signed)
Stable on HCTZ 12.5 mg and will continue.

## 2014-07-23 NOTE — Patient Instructions (Addendum)
Let me know if we need an EKG or any specific labs prior to your surgery in June.   Keep up the good work!

## 2014-07-23 NOTE — Assessment & Plan Note (Signed)
Improving. Visit 3 of 7 today. Pt is not gaining weight and has lost 1 lb according to our scale. - 3 lbs according to her home scale. She has been compliant with her other visits. She has completed all required labs, procedures, dietary sessions, exercise sessions, and psychology visits. She is compliant on her HCTZ medication and feels improved. I am very pleased with her progress and I feel she is a great candidate for this surgery.

## 2014-07-23 NOTE — Progress Notes (Signed)
Pre visit review using our clinic review tool, if applicable. No additional management support is needed unless otherwise documented below in the visit note. 

## 2014-07-23 NOTE — Progress Notes (Signed)
Subjective:    Patient ID: Janet Gonzales, female    DOB: 09-11-1980, 34 y.o.   MRN: [id number]  HPI  Janet Gonzales is a 34 yo female following up for bariatric surgery.   1) Today is 3 of 7 visits.    2) Nutritionist met with her last week-turned in food journals  Pre-op appointment in June for gastric sleeve surgery Diet- Protein, no fast foods, no soda, no caffeine  Exercise- 15 min. Walk on [employer] and walking with kids while they ride their bikes  Goals- not to gain weight  Saw psychology in Feb. And she was checked off for that   Wt Readings from Last 3 Encounters:  07/23/14 288 lb 6.4 oz (130.817 kg)  06/18/14 289 lb (131.09 kg)  05/21/14 289 lb (131.09 kg)   3) After starting the HCTZ she starting feeling better she reports.  4) She has no complaints and remains excited and nervous for her future surgery.    Review of Systems  Constitutional: Negative for fever, chills, diaphoresis and fatigue.  Respiratory: Negative for chest tightness, shortness of breath and wheezing.   Cardiovascular: Negative for chest pain, palpitations and leg swelling.  Gastrointestinal: Negative for nausea, vomiting, diarrhea and constipation.  Skin: Negative for rash.  Neurological: Negative for dizziness, weakness, numbness and headaches.  Psychiatric/Behavioral: The patient is not nervous/anxious.    Past Medical History  Diagnosis Date  . GERD (gastroesophageal reflux disease)   . Hx of pyelonephritis   . Obesity   . H/O hiatal hernia   . History of chicken pox   . History of hay fever   . Hypertension   . HIV infection 2014    Negative    History   Social History  . Marital Status: Single    Spouse Name: N/A  . Number of Children: N/A  . Years of Education: N/A   Occupational History  . Not on file.   Social History Main Topics  . Smoking status: Never Smoker   . Smokeless tobacco: Not on file  . Alcohol Use: No  . Drug Use: No  . Sexual Activity:   Partners: Male     Comment: 1 partner   Other Topics Concern  . 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 Hernia repair  6728    umbilical repaired with mesh    Family History  Problem Relation Age of Onset  . COPD Mother   . Cancer Mother   . Cancer Maternal Grandmother     No Known Allergies  Current Outpatient Prescriptions on File Prior to Visit  Medication Sig Dispense Refill  . hydrochlorothiazide (HYDRODIURIL) 12.5 MG tablet Take 1 tablet (12.5 mg total) by mouth daily. 90 tablet 0   No current facility-administered medications on file prior to visit.      Objective:   Physical Exam  Constitutional: She is oriented to person, place, and time. She appears well-developed and well-nourished. No distress.  BP 118/84 mmHg  Pulse 94  Temp(Src) 98.3 F (36.8 C) (Oral)  Resp 12  Ht '5\' 5"'  (1.651 m)  Wt 288 lb 6.4 oz (130.817 kg)  BMI 47.99 kg/m2  SpO2 95%   HENT:  Head: Normocephalic and atraumatic.  Right Ear: External ear normal.  Left Ear: External ear normal.  Eyes: Right eye exhibits no discharge. Left eye exhibits no discharge. No scleral icterus.  Cardiovascular: Normal rate, regular rhythm and normal heart sounds.  Exam reveals no gallop and no friction rub.   No murmur heard. Pulmonary/Chest: Effort normal and breath sounds normal. No respiratory distress. She has no wheezes. She has no rales. She exhibits no tenderness.  Abdominal: Soft. Bowel sounds are normal. She exhibits no distension and no mass. There is no tenderness. There is no rebound and no guarding.  Neurological: She is alert and oriented to person, place, and time. No cranial nerve deficit. She exhibits normal muscle tone. Coordination  normal.  Skin: Skin is warm and dry. No rash noted. She is not diaphoretic.  Psychiatric: She has a normal mood and affect. Her behavior is normal. Judgment and thought content normal.      Assessment & Plan:

## 2014-07-26 ENCOUNTER — Telehealth: Payer: Self-pay | Admitting: Nurse Practitioner

## 2014-07-26 NOTE — Telephone Encounter (Signed)
emmi mailed  °

## 2014-08-20 ENCOUNTER — Ambulatory Visit: Payer: BLUE CROSS/BLUE SHIELD | Admitting: Nurse Practitioner

## 2014-08-24 ENCOUNTER — Encounter: Payer: Self-pay | Admitting: Nurse Practitioner

## 2014-08-24 ENCOUNTER — Ambulatory Visit (INDEPENDENT_AMBULATORY_CARE_PROVIDER_SITE_OTHER): Payer: BLUE CROSS/BLUE SHIELD | Admitting: Nurse Practitioner

## 2014-08-24 VITALS — BP 126/84 | HR 78 | Temp 98.0°F | Resp 14 | Ht 65.0 in | Wt 295.8 lb

## 2014-08-24 DIAGNOSIS — I10 Essential (primary) hypertension: Secondary | ICD-10-CM | POA: Diagnosis not present

## 2014-08-24 MED ORDER — PHENTERMINE HCL 37.5 MG PO TABS: 37.5000 mg | ORAL_TABLET | Freq: Every day | ORAL | Status: DC

## 2014-08-24 NOTE — Patient Instructions (Signed)
I have written a prescription for  phentermine for 1 month.   Please have your vital signs checked a week after starting and let me know the results.  I will need those measurements, or you can return for an RN visit, before I can refill the phentermine for additional months.  If pulse is > 100 or BP is > 140/90 we should repeat your evaluation after being off  of phentermine for a few days.  If both are below those parameters,  You can continue the medication and return to see me in 3 months.  Most people take 1/2 tablet in the morning,  The second half by 2 PM to avoid insomnia.  Goal- below in the 280's next time.

## 2014-08-24 NOTE — Progress Notes (Signed)
   Subjective:    Patient ID: Janet Gonzales, female    DOB: 26-Jun-1980, 34 y.o.   MRN: [id number]  HPI   Janet Gonzales is a 34 yo female with a CC of following op on HTN and Obesity.   1) Weight- Grandmother was in hospital and this got her off track.   Wt Readings from Last 3 Encounters:  08/24/14 295 lb 12.8 oz (134.174 kg)  07/23/14 288 lb 6.4 oz (130.817 kg)  06/18/14 289 lb (131.09 kg)   Back on track this week with protein shakes.  Still atkins protein bars, veggies, meat, no extra salt intake  Exercise- New app with 20 min work outs, sister encouraged her to get back on track. Tomorrow getting back on track.  Goal to get back off extra pounds and stay active.   Review of Systems  Constitutional: Negative for fever, chills, diaphoresis, appetite change, fatigue and unexpected weight change.  Respiratory: Negative for cough, chest tightness and wheezing.   Gastrointestinal: Negative for nausea, vomiting, abdominal pain, diarrhea and abdominal distention.      Objective:   Physical Exam  Constitutional: She is oriented to person, place, and time. She appears well-developed and well-nourished. No distress.  BP 126/84 mmHg  Pulse 78  Temp(Src) 98 F (36.7 C) (Oral)  Resp 14  Ht 5\' 5"  (1.651 m)  Wt 295 lb 12.8 oz (134.174 kg)  BMI 49.22 kg/m2  SpO2 99%  LMP    HENT:  Head: Normocephalic and atraumatic.  Right Ear: External ear normal.  Left Ear: External ear normal.  Cardiovascular: Normal rate, regular rhythm, normal heart sounds and intact distal pulses.  Exam reveals no gallop and no friction rub.   No murmur heard. Pulmonary/Chest: Effort normal and breath sounds normal. No respiratory distress. She has no wheezes. She has no rales. She exhibits no tenderness.  Abdominal:  Obese  Neurological: She is alert and oriented to person, place, and time. No cranial nerve deficit. She exhibits normal muscle tone. Coordination normal.  Skin: Skin is warm and dry. No rash  noted. She is not diaphoretic.  Psychiatric: She has a normal mood and affect. Her behavior is normal. Judgment and thought content normal.      Assessment & Plan:

## 2014-08-24 NOTE — Progress Notes (Signed)
Pre visit review using our clinic review tool, if applicable. No additional management support is needed unless otherwise documented below in the visit note. 

## 2014-08-27 ENCOUNTER — Ambulatory Visit: Payer: BLUE CROSS/BLUE SHIELD | Admitting: Nurse Practitioner

## 2014-08-29 NOTE — Assessment & Plan Note (Signed)
Stable on HCTZ 12.5 mg.   BP Readings from Last 3 Encounters:  08/24/14 126/84  07/23/14 118/84  06/18/14 144/82

## 2014-08-29 NOTE — Assessment & Plan Note (Addendum)
Visit 4/7 today. Gained 7 lbs. Pt is disappointed. She is very motivated to get back on track, lose this weight, she is back on track with her diet and is planning to exercise again shortly. Will start phentermine to help her on her journey.   Wt Readings from Last 3 Encounters:  08/24/14 295 lb 12.8 oz (134.174 kg)  07/23/14 288 lb 6.4 oz (130.817 kg)  06/18/14 289 lb (131.09 kg)

## 2014-09-24 ENCOUNTER — Ambulatory Visit (INDEPENDENT_AMBULATORY_CARE_PROVIDER_SITE_OTHER): Payer: BLUE CROSS/BLUE SHIELD | Admitting: Nurse Practitioner

## 2014-09-24 ENCOUNTER — Encounter: Payer: Self-pay | Admitting: Nurse Practitioner

## 2014-09-24 DIAGNOSIS — Z91048 Other nonmedicinal substance allergy status: Secondary | ICD-10-CM

## 2014-09-24 DIAGNOSIS — Z9109 Other allergy status, other than to drugs and biological substances: Secondary | ICD-10-CM | POA: Insufficient documentation

## 2014-09-24 DIAGNOSIS — I10 Essential (primary) hypertension: Secondary | ICD-10-CM | POA: Diagnosis not present

## 2014-09-24 MED ORDER — PHENTERMINE HCL 37.5 MG PO TABS: 37.5000 mg | ORAL_TABLET | Freq: Every day | ORAL | Status: DC

## 2014-09-24 MED ORDER — HYDROCHLOROTHIAZIDE 12.5 MG PO TABS: 12.5000 mg | ORAL_TABLET | Freq: Every day | ORAL | Status: DC

## 2014-09-24 NOTE — Progress Notes (Signed)
Pre visit review using our clinic review tool, if applicable. No additional management support is needed unless otherwise documented below in the visit note. 

## 2014-09-24 NOTE — Assessment & Plan Note (Addendum)
Visit 5/6 (pt said insurance is okay with 6 visits)   Lost 10 lbs! Wt Readings from Last 3 Encounters:  09/24/14 285 lb 6.4 oz (129.457 kg)  08/24/14 295 lb 12.8 oz (134.174 kg)  07/23/14 288 lb 6.4 oz (130.817 kg)   Surgery scheduled for next month.   Still doing well, no complaints, and is still motivated. I am impressed with her stamina and ability to jump back in and work hard towards her goals. FU next month. Refilled phentermine for 1 month.

## 2014-09-24 NOTE — Patient Instructions (Signed)
Antihistamines okay (Mucinex Allergy, claritin, zyrtec, allegra ect...)   Follow up in 1 month.   Continue what you are doing.

## 2014-09-24 NOTE — Progress Notes (Signed)
Subjective:    Patient ID: Janet Gonzales, female    DOB: August 06, 1980, 34 y.o.   MRN: [id number]  HPI  Janet Gonzales is a 34 yo female following up for weight loss.   1) Visit 5/7 (having next month). Needs 6  Patient is down 10 lbs today!!  [employer] to check BP and pulse   Pulse running 91-107  No complaints with the phentermine   Diet- back on track, no caffeine, protein, shakes, veggies   Exercise- Videos and walking with the kids   2) Pt reports some sneezing, itchy eyes/watery eyes, and coughing due to allergies.    Review of Systems  Constitutional: Negative for fever, chills, diaphoresis and fatigue.  HENT: Positive for rhinorrhea and sneezing. Negative for congestion, ear discharge, ear pain, postnasal drip, sinus pressure and sore throat.   Eyes: Positive for discharge and itching.  Respiratory: Negative for chest tightness, shortness of breath and wheezing.   Cardiovascular: Negative for chest pain, palpitations and leg swelling.  Gastrointestinal: Negative for nausea, vomiting, diarrhea and constipation.  Skin: Negative for rash.  Neurological: Negative for dizziness, weakness, numbness and headaches.  Psychiatric/Behavioral: The patient is not nervous/anxious.    Past Medical History  Diagnosis Date  . GERD (gastroesophageal reflux disease)   . Hx of pyelonephritis   . Obesity   . H/O hiatal hernia   . History of chicken pox   . History of hay fever   . Hypertension   . HIV infection 2014    Negative    History   Social History  . Marital Status: Single    Spouse Name: N/A  . Number of Children: N/A  . Years of Education: N/A   Occupational History  . Not on file.   Social History Main Topics  . Smoking status: Never Smoker   . Smokeless tobacco: Not on file  . Alcohol Use: No  . Drug Use: No  . Sexual Activity:    Partners: Male     Comment: 1 partner   Other Topics Concern  . 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 Hernia repair  2008    umbilical repaired with mesh    Family History  Problem Relation Age of Onset  . COPD Mother   . Cancer Mother   . Cancer Maternal Grandmother     No Known Allergies  No current outpatient prescriptions on file prior to visit.   No current facility-administered medications on file prior to visit.      Objective:   Physical Exam  Constitutional: She is oriented to person, place, and time. She appears well-developed and well-nourished. No distress.  BP 120/82 mmHg  Pulse 95  Temp(Src) 98.2 F (36.8 C) (Oral)  Resp 12  Ht 5\' 5"  (1.651 m)  Wt 285 lb 6.4 oz (129.457 kg)  BMI 47.49 kg/m2  SpO2 98%    HENT:  Head: Normocephalic and atraumatic.  Right Ear: External ear normal.  Left Ear: External ear normal.  Eyes: EOM are normal. Pupils are equal, round, and reactive to light.  Neck: Normal range of motion. Neck supple.  Cardiovascular: Normal rate, regular rhythm and  normal heart sounds.  Exam reveals no gallop and no friction rub.   No murmur heard. Pulmonary/Chest: Effort normal and breath sounds normal. No respiratory distress. She has no wheezes. She has no rales. She exhibits no tenderness.  Rhonchi heard in upper lobes, clear to auscultation lower lobes  Abdominal:  Obese, soft  Neurological: She is alert and oriented to person, place, and time. No cranial nerve deficit. She exhibits normal muscle tone. Coordination normal.  Skin: Skin is warm and dry. No rash noted. She is not diaphoretic.  Psychiatric: She has a normal mood and affect. Her behavior is normal. Judgment and thought content normal.      Assessment & Plan:

## 2014-09-24 NOTE — Assessment & Plan Note (Addendum)
Stable. Refills sent to pharmacy  BP Readings from Last 3 Encounters:  09/24/14 120/82  08/24/14 126/84  07/23/14 118/84

## 2014-09-24 NOTE — Assessment & Plan Note (Signed)
Allergy medication okay with other medications according to Uptodate interactions. Will follow up next month.

## 2014-10-22 ENCOUNTER — Ambulatory Visit (INDEPENDENT_AMBULATORY_CARE_PROVIDER_SITE_OTHER): Payer: BLUE CROSS/BLUE SHIELD | Admitting: Nurse Practitioner

## 2014-10-22 ENCOUNTER — Encounter (INDEPENDENT_AMBULATORY_CARE_PROVIDER_SITE_OTHER): Payer: Self-pay

## 2014-10-22 ENCOUNTER — Encounter: Payer: Self-pay | Admitting: Nurse Practitioner

## 2014-10-22 DIAGNOSIS — I10 Essential (primary) hypertension: Secondary | ICD-10-CM

## 2014-10-22 NOTE — Assessment & Plan Note (Signed)
BP stable. Asked her to take her BP at home and take the pill when doing the shakes if 140/90 and above. If below, do not take.   BP Readings from Last 3 Encounters:  10/22/14 120/82  09/24/14 120/82  08/24/14 126/84

## 2014-10-22 NOTE — Progress Notes (Signed)
   Subjective:    Patient ID: Janet Gonzales, female    DOB: [birth year], 34 y.o.   MRN: [id number]  HPI  [name] is a 34 yo female with a 1 month follow up for weight loss.   1) Nutrition appointment last week, doing pre-surgery shakes, 4 shakes a day for 2 weeks. No other questions or concerns. Her insurance reports she only needs 6 months of visits with no specified number and the lady scheduling surgery told her 7 visits. She will try to submit through her insurance to get the go ahead or will come next month if she needs the 7th visit.   Wt Readings from Last 3 Encounters:  10/22/14 279 lb 12.8 oz (126.916 kg)  09/24/14 285 lb 6.4 oz (129.457 kg)  08/24/14 295 lb 12.8 oz (134.174 kg)   Review of Systems  Constitutional: Negative for fever, chills, diaphoresis and fatigue.  Respiratory: Negative for chest tightness, shortness of breath and wheezing.   Cardiovascular: Negative for chest pain, palpitations and leg swelling.  Gastrointestinal: Negative for nausea, vomiting and diarrhea.  Skin: Negative for rash.  Neurological: Negative for dizziness, weakness, numbness and headaches.  Psychiatric/Behavioral: The patient is not nervous/anxious.        Objective:   Physical Exam  Constitutional: She is oriented to person, place, and time. She appears well-developed and well-nourished. No distress.  BP 120/82 mmHg  Pulse 100  Temp(Src) 98.2 F (36.8 C)  Resp 14  Ht 5\' 5"  (1.651 m)  Wt 279 lb 12.8 oz (126.916 kg)  BMI 46.56 kg/m2  SpO2 95%   HENT:  Head: Normocephalic and atraumatic.  Right Ear: External ear normal.  Left Ear: External ear normal.  Eyes: EOM are normal. Pupils are equal, round, and reactive to light. Right eye exhibits no discharge. Left eye exhibits no discharge. No scleral icterus.  Cardiovascular: Normal rate, regular rhythm and normal heart sounds.  Exam reveals no gallop and no friction rub.   No murmur heard. Pulmonary/Chest: Effort normal and  breath sounds normal. No respiratory distress. She has no wheezes. She has no rales. She exhibits no tenderness.  Abdominal: Soft.  Obese  Neurological: She is alert and oriented to person, place, and time. No cranial nerve deficit. She exhibits normal muscle tone. Coordination normal.  Skin: Skin is warm and dry. No rash noted. She is not diaphoretic.  Psychiatric: She has a normal mood and affect. Her behavior is normal. Judgment and thought content normal.      Assessment & Plan:

## 2014-10-22 NOTE — Assessment & Plan Note (Signed)
Visit 6/6 today (may need a 7th). Patient is continuing to lose weight. She is excited and very focused. She has her shakes and ready for the last 2 weeks pre-surgery. No complaints or concerns today. She is ready!  Wt Readings from Last 3 Encounters:  10/22/14 279 lb 12.8 oz (126.916 kg)  09/24/14 285 lb 6.4 oz (129.457 kg)  08/24/14 295 lb 12.8 oz (134.174 kg)

## 2014-10-22 NOTE — Patient Instructions (Signed)
Check you BP at home for the last 2 weeks and if 140/90 or above please take your HCTZ.   Good luck and see you either next month or after your surgery!

## 2014-10-22 NOTE — Progress Notes (Signed)
Pre visit review using our clinic review tool, if applicable. No additional management support is needed unless otherwise documented below in the visit note. 

## 2014-10-29 ENCOUNTER — Other Ambulatory Visit: Payer: Self-pay

## 2014-10-29 MED ORDER — PHENTERMINE HCL 37.5 MG PO TABS: 37.5000 mg | ORAL_TABLET | Freq: Every day | ORAL | Status: DC

## 2014-10-29 NOTE — Telephone Encounter (Signed)
Ok to refill, last OV 6.10.16

## 2014-10-29 NOTE — Telephone Encounter (Signed)
Faxed to pharmacy

## 2014-11-19 ENCOUNTER — Encounter: Payer: Self-pay | Admitting: Nurse Practitioner

## 2014-11-19 ENCOUNTER — Ambulatory Visit (INDEPENDENT_AMBULATORY_CARE_PROVIDER_SITE_OTHER): Payer: BLUE CROSS/BLUE SHIELD | Admitting: Nurse Practitioner

## 2014-11-19 NOTE — Patient Instructions (Addendum)
We will fax your last visit note to   Attn: Winfield Rastina Hall [phone]  Good luck and see you are surgery!

## 2014-11-19 NOTE — Progress Notes (Signed)
   Subjective:    Patient ID: Janet Gonzales, female    DOB: Apr 04, 1981, 34 y.o.   MRN: [id number]  HPI  Janet Gonzales is a 34 yo female with a follow up on weight loss.   1) Unsure of date of surgery. Needed this last visit # 7.  Wt Readings from Last 3 Encounters:  11/19/14 275 lb 6.4 oz (124.921 kg)  10/22/14 279 lb 12.8 oz (126.916 kg)  09/24/14 285 lb 6.4 oz (129.457 kg)   Diet- No changes, still diligent Exercise- No changes, still diligent   2) HTN- no concerns with medication    Review of Systems  Constitutional: Negative for fever, chills, diaphoresis, fatigue and unexpected weight change.  Respiratory: Negative for chest tightness, shortness of breath and wheezing.   Cardiovascular: Negative for chest pain, palpitations and leg swelling.  Gastrointestinal: Negative for nausea, vomiting, diarrhea and constipation.  Skin: Negative for rash.  Neurological: Negative for dizziness, weakness, numbness and headaches.  Psychiatric/Behavioral: The patient is not nervous/anxious.       Objective:   Physical Exam  Constitutional: She is oriented to person, place, and time. She appears well-developed and well-nourished. No distress.  BP 128/88 mmHg  Pulse 89  Temp(Src) 97.7 F (36.5 C)  Resp 16  Ht 5\' 5"  (1.651 m)  Wt 275 lb 6.4 oz (124.921 kg)  BMI 45.83 kg/m2  SpO2 98%   HENT:  Head: Normocephalic and atraumatic.  Right Ear: External ear normal.  Left Ear: External ear normal.  Cardiovascular: Normal rate, regular rhythm, normal heart sounds and intact distal pulses.  Exam reveals no gallop and no friction rub.   No murmur heard. Pulmonary/Chest: Effort normal and breath sounds normal. No respiratory distress. She has no wheezes. She has no rales. She exhibits no tenderness.  Abdominal:  Obese  Neurological: She is alert and oriented to person, place, and time. No cranial nerve deficit. She exhibits normal muscle tone. Coordination normal.  Skin: Skin is warm and  dry. No rash noted. She is not diaphoretic.  Psychiatric: She has a normal mood and affect. Her behavior is normal. Judgment and thought content normal.      Assessment & Plan:

## 2014-11-19 NOTE — Progress Notes (Signed)
Pre visit review using our clinic review tool, if applicable. No additional management support is needed unless otherwise documented below in the visit note. 

## 2014-11-19 NOTE — Assessment & Plan Note (Addendum)
Visit 7/7 today. Down 4 lbs from last visit. She is still working on diet and exercise. She is focused and still excited. Will follow up after surgery  Wt Readings from Last 3 Encounters:  11/19/14 275 lb 6.4 oz (124.921 kg)  10/22/14 279 lb 12.8 oz (126.916 kg)  09/24/14 285 lb 6.4 oz (129.457 kg)

## 2014-12-02 ENCOUNTER — Other Ambulatory Visit: Payer: Self-pay | Admitting: Nurse Practitioner

## 2014-12-02 NOTE — Telephone Encounter (Signed)
Last OV 7.8.16, last refill 6.17.16.  Please advise refill

## 2014-12-02 NOTE — Telephone Encounter (Signed)
Rx faxed

## 2014-12-03 ENCOUNTER — Other Ambulatory Visit: Payer: Self-pay | Admitting: Nurse Practitioner

## 2018-01-30 ENCOUNTER — Telehealth: Payer: Self-pay

## 2018-01-30 NOTE — Telephone Encounter (Signed)
Copied from CRM [id number]. Topic: Appointment Scheduling - New Patient >> Jan 30, 2018  9:46 AM Arlyss Gandyichardson, Taren N, NT wrote: New patient has been scheduled for your office. Provider: Leanora CoverLauren Guse Date of Appointment: 02/03/18  Route to department's PEC pool.

## 2018-02-03 ENCOUNTER — Encounter: Payer: Self-pay | Admitting: Family Medicine

## 2018-02-03 ENCOUNTER — Ambulatory Visit (INDEPENDENT_AMBULATORY_CARE_PROVIDER_SITE_OTHER): Payer: BLUE CROSS/BLUE SHIELD | Admitting: Family Medicine

## 2018-02-03 VITALS — BP 138/82 | HR 77 | Temp 98.6°F | Ht 64.0 in | Wt 226.0 lb

## 2018-02-03 DIAGNOSIS — Z23 Encounter for immunization: Secondary | ICD-10-CM

## 2018-02-03 DIAGNOSIS — Z1159 Encounter for screening for other viral diseases: Secondary | ICD-10-CM | POA: Diagnosis not present

## 2018-02-03 DIAGNOSIS — Z111 Encounter for screening for respiratory tuberculosis: Secondary | ICD-10-CM | POA: Diagnosis not present

## 2018-02-03 DIAGNOSIS — E538 Deficiency of other specified B group vitamins: Secondary | ICD-10-CM

## 2018-02-03 DIAGNOSIS — E559 Vitamin D deficiency, unspecified: Secondary | ICD-10-CM

## 2018-02-03 DIAGNOSIS — Z8619 Personal history of other infectious and parasitic diseases: Secondary | ICD-10-CM | POA: Diagnosis not present

## 2018-02-03 DIAGNOSIS — R5383 Other fatigue: Secondary | ICD-10-CM

## 2018-02-03 DIAGNOSIS — Z789 Other specified health status: Secondary | ICD-10-CM

## 2018-02-03 LAB — CBC
HCT: 39.5 % (ref 36.0–46.0)
Hemoglobin: 13.4 g/dL (ref 12.0–15.0)
MCHC: 33.8 g/dL (ref 30.0–36.0)
MCV: 89.8 fl (ref 78.0–100.0)
Platelets: 244 10*3/uL (ref 150.0–400.0)
RBC: 4.4 Mil/uL (ref 3.87–5.11)
RDW: 13.8 % (ref 11.5–15.5)
WBC: 5.7 10*3/uL (ref 4.0–10.5)

## 2018-02-03 LAB — B12 AND FOLATE PANEL
Folate: 7.3 ng/mL (ref >5.9)
Vitamin B-12: 164 pg/mL — ABNORMAL LOW (ref 211–911)

## 2018-02-03 LAB — COMPREHENSIVE METABOLIC PANEL
ALT: 8 U/L (ref 0–35)
AST: 11 U/L (ref 0–37)
Albumin: 3.9 g/dL (ref 3.5–5.2)
Alkaline Phosphatase: 69 U/L (ref 39–117)
BUN: 10 mg/dL (ref 6–23)
CO2: 27 mEq/L (ref 19–32)
Calcium: 8.6 mg/dL (ref 8.4–10.5)
Chloride: 105 mEq/L (ref 96–112)
Creatinine, Ser: 0.75 mg/dL (ref 0.40–1.20)
GFR: 110.71 mL/min (ref >60.00)
Glucose, Bld: 105 mg/dL — ABNORMAL HIGH (ref 70–99)
Potassium: 3.9 mEq/L (ref 3.5–5.1)
Sodium: 137 mEq/L (ref 135–145)
Total Bilirubin: 0.7 mg/dL (ref 0.2–1.2)
Total Protein: 6.4 g/dL (ref 6.0–8.3)

## 2018-02-03 LAB — VITAMIN D 25 HYDROXY (VIT D DEFICIENCY, FRACTURES): VITD: 12.77 ng/mL — ABNORMAL LOW (ref 30.00–100.00)

## 2018-02-03 NOTE — Progress Notes (Signed)
Subjective:    Patient ID: Janet Gonzales, female    DOB: 08-02-1980, 37 y.o.   MRN: [id number]  HPI   Patient presents to clinic to establish primary care with new PCP.  Past medical history, surgical history, social history all history, family history reviewed and updated in chart.  Patient's main concern today is feeling of fatigue.  States she just feels drained and rundown all the time.  Denies feeling down or feeling blue.  Feels she gets a decent amount of sleep per night, 6 to 7 hours.  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  Patient also needs a PPD, and vaccination titers for school attendance.  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  Past Medical History:  Diagnosis Date  . H/O hiatal hernia   . History of chicken pox   . History of hay fever   . Hx of pyelonephritis   . Obesity    Family History  Problem Relation Age of Onset  . COPD Mother   . Cancer Mother   . Asthma Mother   . Depression Mother   . Drug abuse Mother   . Cancer Maternal Grandmother   . Alcohol abuse Maternal Grandmother   . Early death Maternal Grandmother   . Depression Sister    Past Surgical History:  Procedure Laterality Date  . HERNIA REPAIR  2008   umbilical repaired with mesh  . LAPAROSCOPIC GASTRIC BAND REMOVAL WITH LAPAROSCOPIC GASTRIC SLEEVE RESECTION  2003   Review of Systems  Constitutional: Positive for fatigue.   HENT: Negative for congestion, ear pain, sinus pain and sore throat.   Eyes: Negative.   Respiratory: Negative for cough, shortness of breath and wheezing.   Cardiovascular: Negative for chest pain, palpitations and leg swelling.  Gastrointestinal: Negative for abdominal pain, diarrhea, nausea and vomiting.  Genitourinary: Negative for dysuria, frequency and urgency.  Musculoskeletal: Negative for arthralgias and myalgias.  Skin: Negative for color change, pallor and rash.  Neurological: Negative for syncope,  light-headedness and headaches.  Psychiatric/Behavioral: The patient is not nervous/anxious.       Objective:   Physical Exam  Constitutional: She is oriented to person, place, and time. She appears well-developed and well-nourished. No distress.  HENT:  Head: Normocephalic and atraumatic.  Eyes: No scleral icterus.  Neck: Neck supple. No tracheal deviation present.  Cardiovascular: Normal rate and regular rhythm.  Pulmonary/Chest: Effort normal and breath sounds normal. No respiratory distress.  Musculoskeletal: Normal range of motion. She exhibits no edema or deformity.  Neurological: She is alert and oriented to person, place, and time.  Skin: Skin is warm and dry. Capillary refill takes less than 2 seconds. No pallor.  Psychiatric: She has a normal mood and affect. Her behavior is normal. Thought content normal.  Nursing note and vitals reviewed.  Vitals:   02/03/18 0830 02/03/18 0907  BP: (!) 152/98 138/82  Pulse: 77   Temp: 98.6 F (37 C)   SpO2: 97%       Assessment & Plan:    Fatigue - We will get lab work to further evaluate including CBC, iron, thyroid, electrolyte panel, vitamin D and B12.  PPD given in clinic for tuberculosis screening.  Titers drawn to assess for immunity to varicella, measles, mumps, rubella, hepatitis B.  Flu vaccine given in clinic.  Patient will follow-up in 2 days for PPD read.  Results of lab studies will be determine our next step in plan of care  in regards to fatigue

## 2018-02-03 NOTE — Patient Instructions (Signed)
Tdap given 2016

## 2018-02-04 LAB — THYROID PANEL WITH TSH
Free Thyroxine Index: 1.8 (ref 1.4–3.8)
T3 Uptake: 30 % (ref 22–35)
T4, Total: 6.1 ug/dL (ref 5.1–11.9)
TSH: 1.09 mIU/L

## 2018-02-04 LAB — HEPATITIS B SURFACE ANTIGEN: Hepatitis B Surface Ag: NONREACTIVE

## 2018-02-04 LAB — VARICELLA ZOSTER ABS, IGG/IGM
Varicella IgM: <0.91 index (ref 0.00–0.90)
Varicella zoster IgG: 760 index (ref >165)

## 2018-02-04 LAB — IRON,TIBC AND FERRITIN PANEL
%SAT: 36 % (calc) (ref 16–45)
Ferritin: 47 ng/mL (ref 16–154)
Iron: 104 ug/dL (ref 40–190)
TIBC: 289 mcg/dL (calc) (ref 250–450)

## 2018-02-04 LAB — MEASLES/MUMPS/RUBELLA IMMUNITY
Mumps IgG: <9 AU/mL — ABNORMAL LOW
Rubella: 9.4 index
Rubeola IgG: 163 AU/mL

## 2018-02-04 LAB — HEPATITIS B CORE ANTIBODY, IGM: Hep B C IgM: NONREACTIVE

## 2018-02-04 LAB — HEPATITIS B SURFACE ANTIBODY,QUALITATIVE: Hep B S Ab: NONREACTIVE

## 2018-02-05 ENCOUNTER — Ambulatory Visit: Payer: BLUE CROSS/BLUE SHIELD | Admitting: *Deleted

## 2018-02-05 DIAGNOSIS — Z111 Encounter for screening for respiratory tuberculosis: Secondary | ICD-10-CM

## 2018-02-05 LAB — TB SKIN TEST
Induration: 0 mm
TB Skin Test: NEGATIVE

## 2018-02-05 MED ORDER — VITAMIN D (ERGOCALCIFEROL) 1.25 MG (50000 UNIT) PO CAPS: 50000.0000 [IU] | ORAL_CAPSULE | ORAL | 1 refills | Status: AC

## 2018-02-05 MED ORDER — VITAMIN B-12 1000 MCG PO TABS: 1000.0000 ug | ORAL_TABLET | Freq: Every day | ORAL | 1 refills | Status: AC

## 2018-02-05 NOTE — Progress Notes (Signed)
Patient presented for PPD read to right arm no redness at site or induration.

## 2018-02-05 NOTE — Addendum Note (Signed)
Addended by: Leanora Cover on: 02/05/2018 02:54 PM   Modules accepted: Orders

## 2018-02-06 ENCOUNTER — Ambulatory Visit (INDEPENDENT_AMBULATORY_CARE_PROVIDER_SITE_OTHER): Payer: BLUE CROSS/BLUE SHIELD

## 2018-02-06 DIAGNOSIS — Z23 Encounter for immunization: Secondary | ICD-10-CM | POA: Diagnosis not present

## 2018-02-06 NOTE — Progress Notes (Signed)
Patient comes in for MMR and Hepatitis B injection. Injected MMR in right deltoid subcutaneously . Hepatis B injected into left deltoid. She was instructed to come back in 1 month for 2nd Hepatitis B injection  Patient tolerated injections well.

## 2018-03-12 ENCOUNTER — Ambulatory Visit: Payer: BLUE CROSS/BLUE SHIELD

## 2019-05-27 ENCOUNTER — Emergency Department
Admission: EM | Admit: 2019-05-27 | Discharge: 2019-05-27 | Disposition: A | Discharge status: Home / Self Care | Payer: BC Managed Care – PPO | Attending: Emergency Medicine | Admitting: Emergency Medicine

## 2019-05-27 ENCOUNTER — Other Ambulatory Visit: Payer: Self-pay

## 2019-05-27 ENCOUNTER — Encounter: Payer: Self-pay | Admitting: Emergency Medicine

## 2019-05-27 ENCOUNTER — Emergency Department: Payer: BC Managed Care – PPO

## 2019-05-27 DIAGNOSIS — R1084 Generalized abdominal pain: Secondary | ICD-10-CM | POA: Diagnosis present

## 2019-05-27 DIAGNOSIS — K529 Noninfective gastroenteritis and colitis, unspecified: Secondary | ICD-10-CM | POA: Diagnosis not present

## 2019-05-27 DIAGNOSIS — I10 Essential (primary) hypertension: Secondary | ICD-10-CM | POA: Insufficient documentation

## 2019-05-27 LAB — URINALYSIS, COMPLETE (UACMP) WITH MICROSCOPIC
Bilirubin Urine: NEGATIVE
Glucose, UA: NEGATIVE mg/dL
Hgb urine dipstick: NEGATIVE
Ketones, ur: 20 mg/dL — AB
Leukocytes,Ua: NEGATIVE
Nitrite: NEGATIVE
Protein, ur: NEGATIVE mg/dL
Specific Gravity, Urine: 1.014 (ref 1.005–1.030)
pH: 6 (ref 5.0–8.0)

## 2019-05-27 LAB — CBC
HCT: 46.3 % — ABNORMAL HIGH (ref 36.0–46.0)
Hemoglobin: 15.4 g/dL — ABNORMAL HIGH (ref 12.0–15.0)
MCH: 30 pg (ref 26.0–34.0)
MCHC: 33.3 g/dL (ref 30.0–36.0)
MCV: 90.1 fL (ref 80.0–100.0)
Platelets: 308 10*3/uL (ref 150–400)
RBC: 5.14 MIL/uL — ABNORMAL HIGH (ref 3.87–5.11)
RDW: 12.4 % (ref 11.5–15.5)
WBC: 13.8 10*3/uL — ABNORMAL HIGH (ref 4.0–10.5)
nRBC: 0 % (ref 0.0–0.2)

## 2019-05-27 LAB — COMPREHENSIVE METABOLIC PANEL
ALT: 22 U/L (ref 0–44)
AST: 25 U/L (ref 15–41)
Albumin: 4.3 g/dL (ref 3.5–5.0)
Alkaline Phosphatase: 100 U/L (ref 38–126)
Anion gap: 11 (ref 5–15)
BUN: 10 mg/dL (ref 6–20)
CO2: 21 mmol/L — ABNORMAL LOW (ref 22–32)
Calcium: 9.1 mg/dL (ref 8.9–10.3)
Chloride: 105 mmol/L (ref 98–111)
Creatinine, Ser: 0.65 mg/dL (ref 0.44–1.00)
GFR calc Af Amer: >60 mL/min (ref >60)
GFR calc non Af Amer: >60 mL/min (ref >60)
Glucose, Bld: 104 mg/dL — ABNORMAL HIGH (ref 70–99)
Potassium: 4.1 mmol/L (ref 3.5–5.1)
Sodium: 137 mmol/L (ref 135–145)
Total Bilirubin: 1.1 mg/dL (ref 0.3–1.2)
Total Protein: 7.6 g/dL (ref 6.5–8.1)

## 2019-05-27 LAB — POCT PREGNANCY, URINE: Preg Test, Ur: NEGATIVE

## 2019-05-27 LAB — LIPASE, BLOOD: Lipase: 23 U/L (ref 11–51)

## 2019-05-27 MED ORDER — IOHEXOL 9 MG/ML PO SOLN
1000.0000 mL | Freq: Once | ORAL | Status: AC | PRN
  Administered 2019-05-27: 1000 mL via ORAL
  Filled 2019-05-27: qty 1000

## 2019-05-27 MED ORDER — HYDROCODONE-ACETAMINOPHEN 5-325 MG PO TABS
1.0000 | ORAL_TABLET | Freq: Once | ORAL | Status: AC
  Administered 2019-05-27: 1 via ORAL
  Filled 2019-05-27: qty 1

## 2019-05-27 MED ORDER — IOHEXOL 300 MG/ML  SOLN
100.0000 mL | Freq: Once | INTRAMUSCULAR | Status: AC | PRN
  Administered 2019-05-27: 100 mL via INTRAVENOUS
  Filled 2019-05-27: qty 100

## 2019-05-27 MED ORDER — HYDROCODONE-ACETAMINOPHEN 5-325 MG PO TABS: 1.0000 | ORAL_TABLET | Freq: Four times a day (QID) | ORAL | 0 refills | Status: AC | PRN

## 2019-05-27 MED ORDER — AMOXICILLIN-POT CLAVULANATE 875-125 MG PO TABS: 1.0000 | ORAL_TABLET | Freq: Two times a day (BID) | ORAL | 0 refills | Status: AC

## 2019-05-27 MED ORDER — SODIUM CHLORIDE 0.9% FLUSH: 3.0000 mL | Freq: Once | INTRAVENOUS | Status: DC

## 2019-05-27 MED ORDER — SODIUM CHLORIDE 0.9 % IV BOLUS
1000.0000 mL | Freq: Once | INTRAVENOUS | Status: AC
  Administered 2019-05-27: 1000 mL via INTRAVENOUS

## 2019-05-27 MED ORDER — MORPHINE SULFATE (PF) 4 MG/ML IV SOLN
4.0000 mg | Freq: Once | INTRAVENOUS | Status: AC
  Administered 2019-05-27: 4 mg via INTRAVENOUS
  Filled 2019-05-27: qty 1

## 2019-05-27 MED ORDER — ONDANSETRON 4 MG PO TBDP
4.0000 mg | ORAL_TABLET | Freq: Once | ORAL | Status: AC
  Administered 2019-05-27: 4 mg via ORAL
  Filled 2019-05-27: qty 1

## 2019-05-27 MED ORDER — ONDANSETRON 4 MG PO TBDP: 4.0000 mg | ORAL_TABLET | Freq: Four times a day (QID) | ORAL | 0 refills | Status: AC | PRN

## 2019-05-27 MED ORDER — ONDANSETRON HCL 4 MG/2ML IJ SOLN
4.0000 mg | Freq: Once | INTRAMUSCULAR | Status: AC
  Administered 2019-05-27: 4 mg via INTRAVENOUS
  Filled 2019-05-27: qty 2

## 2019-05-27 MED ORDER — AMOXICILLIN-POT CLAVULANATE 875-125 MG PO TABS
1.0000 | ORAL_TABLET | Freq: Once | ORAL | Status: AC
  Administered 2019-05-27: 1 via ORAL
  Filled 2019-05-27: qty 1

## 2019-05-27 NOTE — Discharge Instructions (Addendum)
   Please return to the emergency room right away if you are to develop a fever, severe nausea, your pain becomes severe or worsens, you are unable to keep food down, begin vomiting any dark or bloody fluid, you develop any dark or bloody stools, feel dehydrated, or other new concerns or symptoms arise.  Also has a mention, your intrauterine device appears slightly lower than expected, please reach out to her OB/GYN team for further advice on this.

## 2019-05-27 NOTE — ED Provider Notes (Signed)
[facility] Emergency Department Provider Note ____________________________________________   First MD Initiated Contact with Patient 05/27/19 1651     (approximate)  I have reviewed the triage vital signs and the nursing notes.  HISTORY  Chief Complaint Abdominal Pain  HPI Janet Gonzales is a 39 y.o. female here for evaluation of vomiting diarrhea mid abdominal pain  Patient reports she began experiencing earlier today a crampy pain that felt like she had to have a stool, then has had several loose watery bowel movements but vomited about a dozen times.  She is had nausea.  Has not had any fever.  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  No trouble breathing.  Reports nausea and moderate crampy mid abdominal pain.  No longer breast-feeding her child.  Denies pregnancy.  Has a IUD.  No lower abdominal pain or pelvic symptoms.  No vaginal discharge or bleeding.  Denies pain in either flank, no pain or burning with urination   Past Medical History:  Diagnosis Date  . H/O hiatal hernia   . History of chicken pox   . History of hay fever   . Hx of pyelonephritis   . Obesity     Patient Active Problem List   Diagnosis Date Noted  . Environmental allergies 09/24/2014  . HTN (hypertension) 06/21/2014  . Severe obesity (BMI >= 40) (HCC) 05/24/2014  . Normal pregnancy 01/30/2013  . SVD (spontaneous vaginal delivery) 01/30/2013    Past Surgical History:  Procedure Laterality Date  . HERNIA REPAIR  2008   umbilical repaired with mesh  . LAPAROSCOPIC GASTRIC BAND REMOVAL WITH LAPAROSCOPIC GASTRIC SLEEVE RESECTION  2003    Prior to Admission medications   Medication Sig Start Date End Date Taking? Authorizing Provider  amoxicillin-clavulanate (AUGMENTIN) 875-125 MG tablet Take 1 tablet by mouth 2 (two) times daily for 7 days. 05/27/19 06/03/19  Sharyn Creamer, MD  HYDROcodone-acetaminophen  (NORCO/VICODIN) 5-325 MG tablet Take 1-2 tablets by mouth every 6 (six) hours as needed for moderate pain. 05/27/19   Sharyn Creamer, MD  ondansetron (ZOFRAN ODT) 4 MG disintegrating tablet Take 1 tablet (4 mg total) by mouth every 6 (six) hours as needed for nausea or vomiting. 05/27/19   Sharyn Creamer, MD  vitamin B-12 (CYANOCOBALAMIN) 1000 MCG tablet Take 1 tablet (1,000 mcg total) by mouth daily. 02/05/18   Tracey Harries, FNP  Vitamin D, Ergocalciferol, (DRISDOL) 50000 units CAPS capsule Take 1 capsule (50,000 Units total) by mouth every 7 (seven) days. 02/05/18   Tracey Harries, FNP    Allergies Patient has no known allergies.  Family History  Problem Relation Age of Onset  . COPD Mother   . Cancer Mother   . Asthma Mother   . Depression Mother   . Drug abuse Mother   . Cancer Maternal Grandmother   . Alcohol abuse Maternal Grandmother   . Early death Maternal Grandmother   . Depression Sister     Social History Social History   Tobacco Use  . Smoking status: Never Smoker  . Smokeless tobacco: Never Used  Substance Use Topics  . Alcohol use: Yes    Alcohol/week: 0.0 standard drinks  . Drug use: No    Review of Systems Constitutional: No fever/chills Eyes: No visual changes. ENT: No sore throat. Cardiovascular: Denies chest pain. Respiratory: Denies shortness of breath. Gastrointestinal: See HPI Genitourinary: Negative for dysuria. Musculoskeletal: Negative for back pain. Neurological: Negative for  headaches.    ____________________________________________   PHYSICAL EXAM:  VITAL SIGNS: ED Triage Vitals [05/27/19 1531]  Enc Vitals Group     BP 92/66     Pulse Rate (!) 108     Resp 18     Temp 98.9 F (37.2 C)     Temp Source Oral     SpO2 99 %     Weight 230 lb (104.3 kg)     Height 5\' 4"  (1.626 m)     Head Circumference      Peak Flow      Pain Score 8     Pain Loc      Pain Edu?      Excl. in GC?     Constitutional: Alert and oriented. Well  appearing and in no acute distress except she does appear in pain.  Very pleasant. Eyes: Conjunctivae are normal. Head: Atraumatic. Nose: No congestion/rhinnorhea. Mouth/Throat: Mucous membranes are moist. Neck: No stridor.  Cardiovascular: Normal rate, regular rhythm. Grossly normal heart sounds.  Good peripheral circulation. Respiratory: Normal respiratory effort.  No retractions. Lungs CTAB. Gastrointestinal: Soft and there is moderate reproducible tenderness in the epigastric periumbilical region without any hernia. No distention.  No pain to rebound.  No pain across the lower abdomen or pelvic regions.  Pain seems to be primarily located epigastric periumbilical region no CVA tenderness bilateral Musculoskeletal: No lower extremity tenderness nor edema. Neurologic:  Normal speech and language. No gross focal neurologic deficits are appreciated.  Skin:  Skin is warm, dry and intact. No rash noted. Psychiatric: Mood and affect are normal. Speech and behavior are normal.  ____________________________________________   LABS (all labs ordered are listed, but only abnormal results are displayed)  Labs Reviewed  COMPREHENSIVE METABOLIC PANEL - Abnormal; Notable for the following components:      Result Value   CO2 21 (*)    Glucose, Bld 104 (*)    All other components within normal limits  CBC - Abnormal; Notable for the following components:   WBC 13.8 (*)    RBC 5.14 (*)    Hemoglobin 15.4 (*)    HCT 46.3 (*)    All other components within normal limits  URINALYSIS, COMPLETE (UACMP) WITH MICROSCOPIC - Abnormal; Notable for the following components:   Color, Urine YELLOW (*)    APPearance CLEAR (*)    Ketones, ur 20 (*)    Bacteria, UA RARE (*)    All other components within normal limits  GI PATHOGEN PANEL BY PCR, STOOL  LIPASE, BLOOD  POC URINE PREG, ED  POCT PREGNANCY, URINE    ____________________________________________  EKG   ____________________________________________  RADIOLOGY  CT ABDOMEN PELVIS W CONTRAST  Result Date: 05/27/2019 CLINICAL DATA:  39 year old female with history of prior gastric sleeve presenting with abdominal pain, nausea vomiting. EXAM: CT ABDOMEN AND PELVIS WITH CONTRAST TECHNIQUE: Multidetector CT imaging of the abdomen and pelvis was performed using the standard protocol following bolus administration of intravenous contrast. CONTRAST:  41 OMNIPAQUE IOHEXOL 300 MG/ML  SOLN COMPARISON:  None. FINDINGS: Lower chest: The visualized lung bases are clear. No intra-abdominal free air.  Small free fluid within the pelvis. Hepatobiliary: Apparent mild fatty infiltration of the liver. No intrahepatic biliary ductal dilatation. The gallbladder is unremarkable. Pancreas: Unremarkable. No pancreatic ductal dilatation or surrounding inflammatory changes. Spleen: Normal in size without focal abnormality. Adrenals/Urinary Tract: The adrenal glands are unremarkable. There is no hydronephrosis on either side. The visualized ureters and urinary bladder appear unremarkable. Stomach/Bowel:  Postsurgical changes of gastric sleeve. There is diffuse inflammatory changes and thickening of long segment small bowel loops most consistent with enteritis. Clinical correlation is recommended. No definite bowel obstruction. There is abutment of multiple loops of small bowel to the anterior peritoneal wall compatible with adhesions. There is sigmoid diverticulosis without active inflammatory changes. The appendix is normal. Vascular/Lymphatic: The abdominal aorta and IVC are unremarkable. No portal venous gas. There is no adenopathy. Reproductive: The uterus is anteverted. An intrauterine device is noted which appears low positioning. Pelvic ultrasound is recommended for better evaluation of the IUD positioning. No adnexal masses. Other: Ventral hernia repair mesh. There is  diffuse mesenteric edema. Musculoskeletal: No acute or significant osseous findings. IMPRESSION: 1. Long segment enteritis. Clinical correlation is recommended. No bowel obstruction. Normal appendix. Number sigmoid diverticulosis. 2. Diffuse mesenteric edema and small ascites. 3. Low appearing position of the IUD. Pelvic ultrasound is recommended for better evaluation. Electronically Signed   By: Anner Crete M.D.   On: 05/27/2019 18:43    Imaging studies reviewed, notable for possible long segment of enteritis.  No obstructive findings.  Also some mesenteric edema and small ascites, discussed with gastroenterology as well feel most likely associated inflammatory changes. ____________________________________________   PROCEDURES  Procedure(s) performed: None  Procedures  Critical Care performed: No  ____________________________________________   INITIAL IMPRESSION / ASSESSMENT AND PLAN / ED COURSE  Pertinent labs & imaging results that were available during my care of the patient were reviewed by me and considered in my medical decision making (see chart for details).   Differential diagnosis includes but is not limited to, abdominal perforation, aortic dissection, cholecystitis, appendicitis, diverticulitis, colitis, esophagitis/gastritis, kidney stone, pyelonephritis, urinary tract infection, aortic aneurysm. All are considered in decision and treatment plan. Based upon the patient's presentation and risk factors, given her previous history of gastric sleeve surgery we will proceed with CT scan.  Await urinalysis and pregnancy test at this time.  Patient does have evidence of elevated white count and mild tachycardia, mild hypotension on arrival.  Receiving IV fluids pain medication and antiemetics.  She appears overall well and stable, but does have pain reproduced primarily in the mid abdominal region.  Denies any associated gynecologic symptoms.  Suspect likely infectious etiology,  most likely given her symptoms self-limited illness however will send stool culture if able to provide, urinalysis, and also CT to further evaluate given her previous history   Clinical Course as of May 26 1944  Wed May 27, 2019  1929 Discussed case and CT imaging with Dr. Allen Norris.    [MQ]    Clinical Course User Index [MQ] Delman Kitten, MD    Imaging studies reviewed discussed with Dr. Allen Norris along with clinical history.  Based on patient's current symptomatology, she does not appear to have any obstructive symptoms.  Seems to most fit a picture of gastroenteritis, either enteritis or colitis  ----------------------------------------- 7:47 PM on 05/27/2019 -----------------------------------------  We will treat with antibiotic antiemetic and pain control. Return precautions and treatment recommendations and follow-up discussed with the patient who is agreeable with the plan.  I will prescribe the patient a narcotic pain medicine due to their condition which I anticipate will cause at least moderate pain short term. Patient is very agreeable to only use as prescribed and to never use more than prescribed.  Patient provided information to follow-up with gastroenterology but also in the interim careful return precautions should she have worsening or concerning symptoms arise.  She is alert, ambulatory with  normal vital signs now.  Appears appropriate for out patient management   ____________________________________________   FINAL CLINICAL IMPRESSION(S) / ED DIAGNOSES  Final diagnoses:  Enteritis        Note:  This document was prepared using Dragon voice recognition software and may include unintentional dictation errors       Sharyn Creamer, MD 05/27/19 1948

## 2019-05-27 NOTE — ED Triage Notes (Signed)
Pt presents to ED via POV with c/o N/V/D and abdominal cramping that started with sudden onset this morning. Pt ambulatory without difficulty, A&O x 4, NAD noted at this time.

## 2019-07-20 ENCOUNTER — Ambulatory Visit: Payer: BC Managed Care – PPO | Admitting: Gastroenterology

## 2020-05-20 IMAGING — CT CT ABD-PELV W/ CM
2 of 4 series · 15 of 46 positions shown, 17 images · IV contrast (APPLIED)
Comparison: None.

CLINICAL DATA: 38-year-old female with history of prior gastric
sleeve presenting with abdominal pain, nausea vomiting.

EXAM:
CT ABDOMEN AND PELVIS WITH CONTRAST
TECHNIQUE: Multidetector CT imaging of the abdomen and pelvis was performed
using the standard protocol following bolus administration of
intravenous contrast.
CONTRAST:  100mL OMNIPAQUE IOHEXOL 300 MG/ML  SOLN

[Series 2: routine abd/pel with · axial · 0.66mm/px · z∈[-497,-72]mm · 12 of 95 slices shown, 14 images]
[im 5/95  soft-tissue]
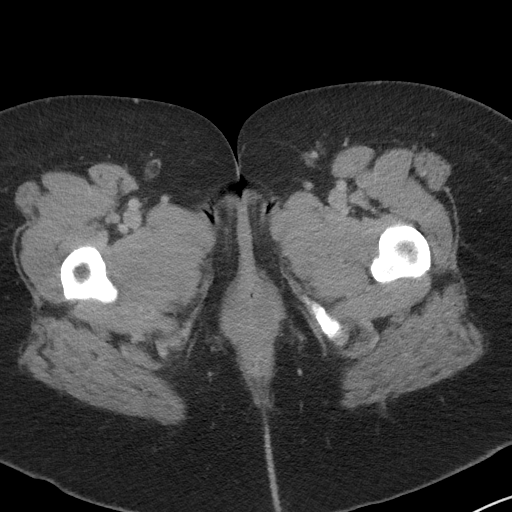
[im 5/95  bone]
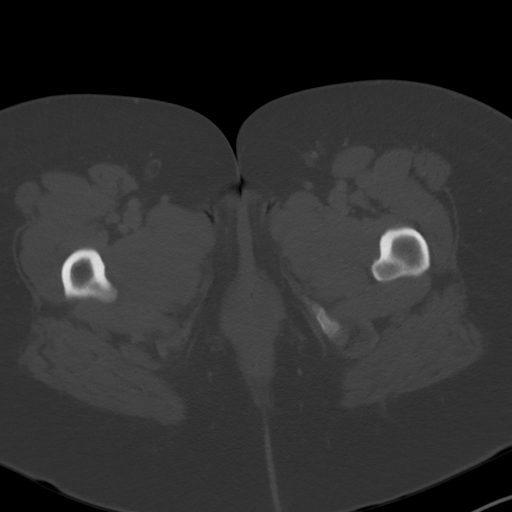
[im 13/95  soft-tissue]
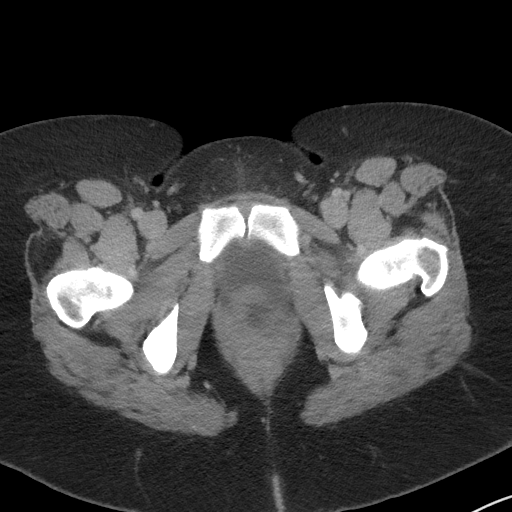
[im 21/95  soft-tissue]
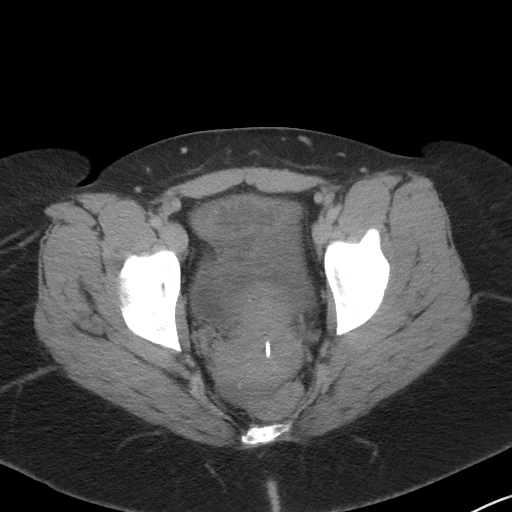
[im 29/95  soft-tissue]
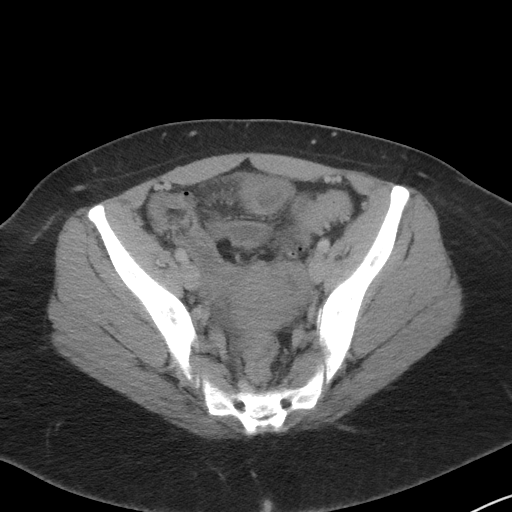
[im 37/95  soft-tissue]
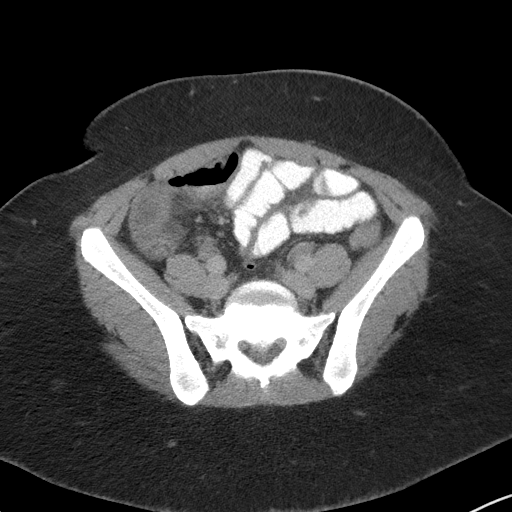
[im 45/95  soft-tissue]
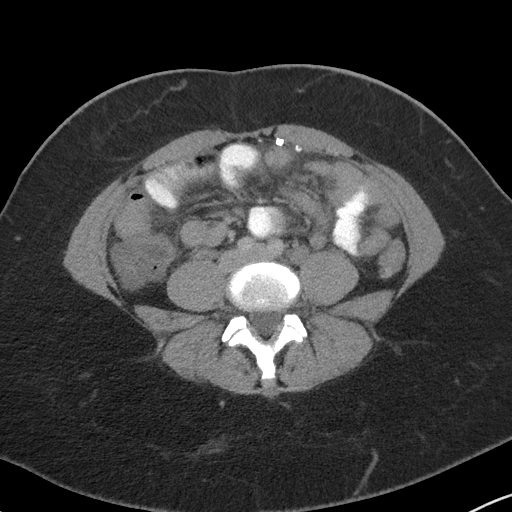
[im 50/95  soft-tissue]
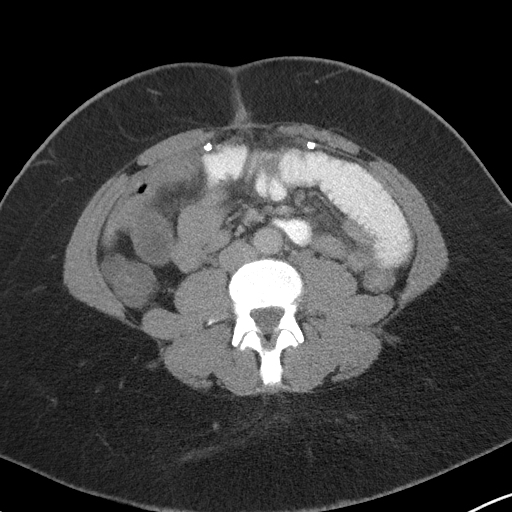
[im 58/95  soft-tissue]
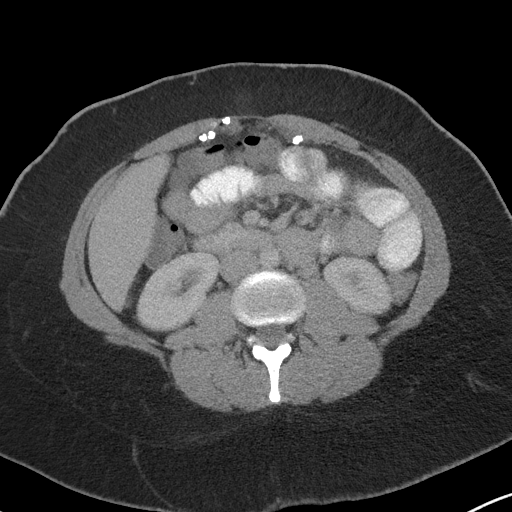
[im 66/95  soft-tissue]
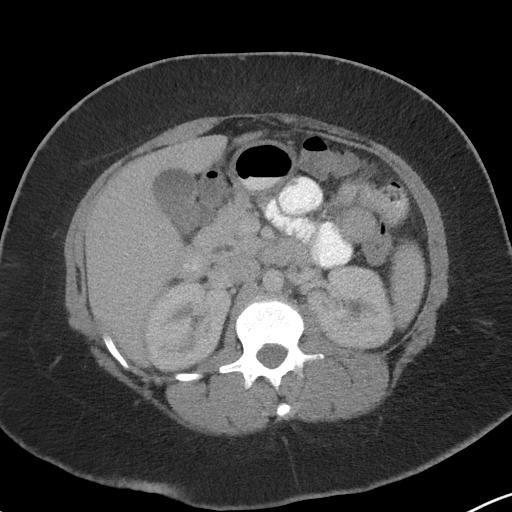
[im 66/95  bone]
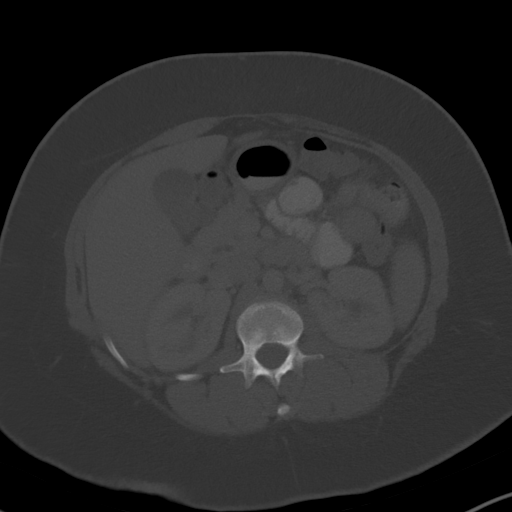
[im 74/95  soft-tissue]
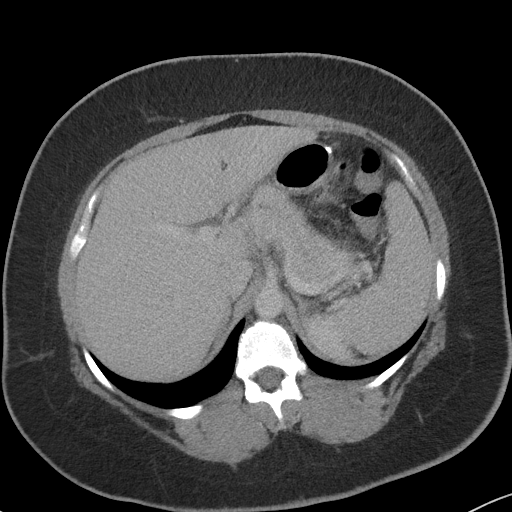
[im 82/95  soft-tissue]
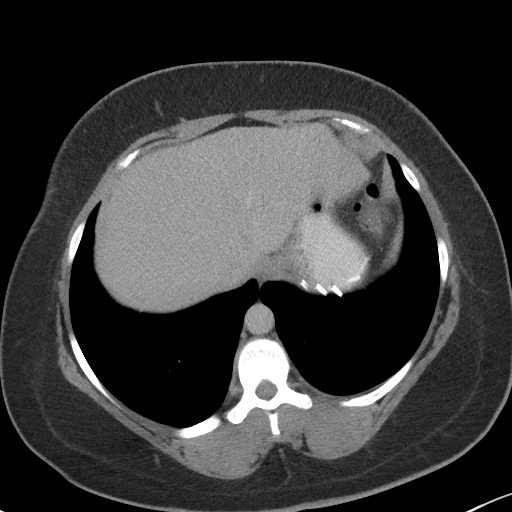
[im 90/95  soft-tissue]
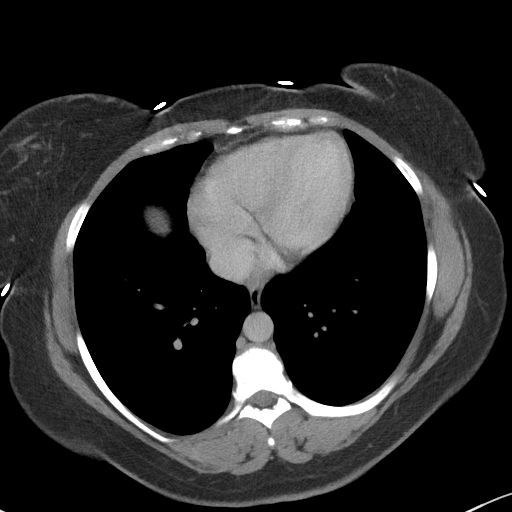

[Series 5: coronal st · coronal · 0.71mm/px · 3 of 88 slices shown]
[im 30/88  soft-tissue]
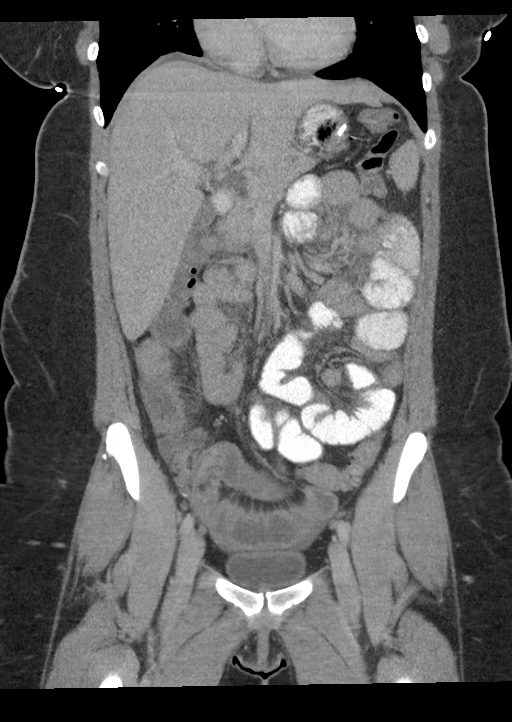
[im 39/88  soft-tissue]
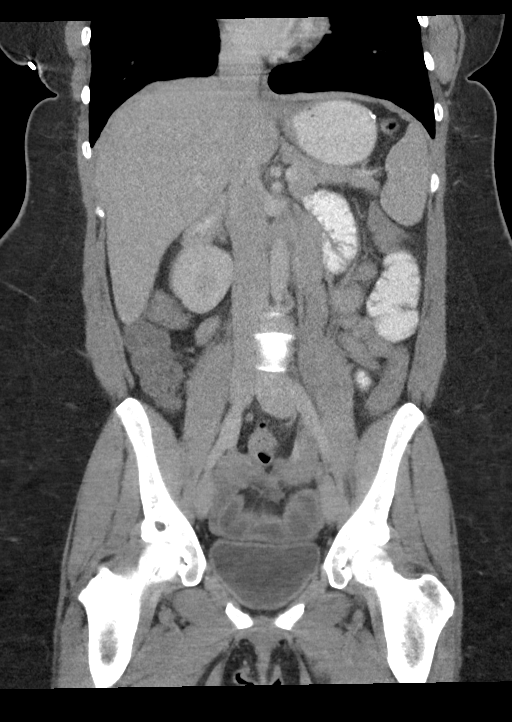
[im 49/88  soft-tissue]
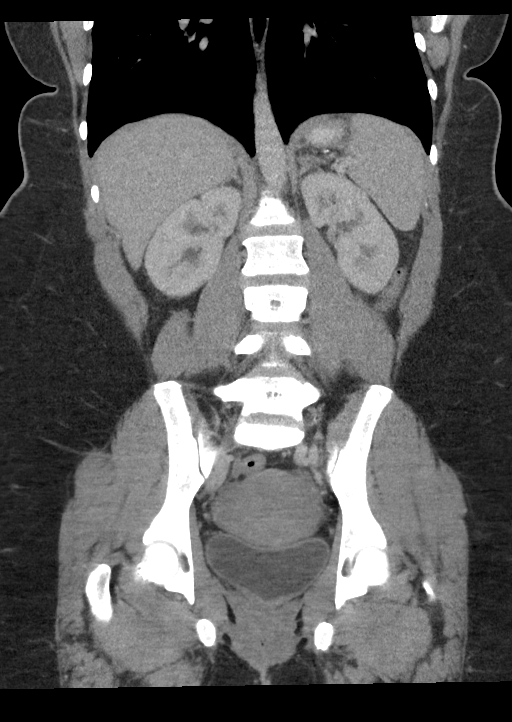

[15 of 46 positions shown; findings below may reference images not displayed]

FINDINGS: Lower chest: The visualized lung bases are clear.

No intra-abdominal free air.  Small free fluid within the pelvis.

Hepatobiliary: Apparent mild fatty infiltration of the liver. No
intrahepatic biliary ductal dilatation. The gallbladder is
unremarkable.

Pancreas: Unremarkable. No pancreatic ductal dilatation or
surrounding inflammatory changes.

Spleen: Normal in size without focal abnormality.

Adrenals/Urinary Tract: The adrenal glands are unremarkable. There
is no hydronephrosis on either side. The visualized ureters and
urinary bladder appear unremarkable.

Stomach/Bowel: Postsurgical changes of gastric sleeve. There is
diffuse inflammatory changes and thickening of long segment small
bowel loops most consistent with enteritis. Clinical correlation is
recommended. No definite bowel obstruction. There is abutment of
multiple loops of small bowel to the anterior peritoneal wall
compatible with adhesions. There is sigmoid diverticulosis without
active inflammatory changes. The appendix is normal.

Vascular/Lymphatic: The abdominal aorta and IVC are unremarkable. No
portal venous gas. There is no adenopathy.

Reproductive: The uterus is anteverted. An intrauterine device is
noted which appears low positioning. Pelvic ultrasound is
recommended for better evaluation of the IUD positioning. No adnexal
masses.

Other: Ventral hernia repair mesh. There is diffuse mesenteric
edema.

Musculoskeletal: No acute or significant osseous findings.
IMPRESSION: 1. Long segment enteritis. Clinical correlation is recommended. No
bowel obstruction. Normal appendix. Number sigmoid diverticulosis.
2. Diffuse mesenteric edema and small ascites.
3. Low appearing position of the IUD. Pelvic ultrasound is
recommended for better evaluation.
